# Patient Record
Sex: Male | Born: 1965 | Race: White | Hispanic: No | Marital: Married | Smoking: Never smoker
Health system: Southern US, Community
[De-identification: ages and names within clinical notes are randomized; demographics above are authoritative.]

## PROBLEM LIST (undated history)

## (undated) DIAGNOSIS — I10 Essential (primary) hypertension: Secondary | ICD-10-CM

## (undated) HISTORY — DX: Essential (primary) hypertension: I10

---

## 2000-02-05 ENCOUNTER — Other Ambulatory Visit: Admission: RE | Admit: 2000-02-05 | Discharge: 2000-02-05 | Payer: Self-pay | Admitting: Family Medicine

## 2004-09-23 ENCOUNTER — Ambulatory Visit (HOSPITAL_COMMUNITY): Admission: RE | Admit: 2004-09-23 | Discharge: 2004-09-23 | Payer: Self-pay | Admitting: Family Medicine

## 2007-10-18 ENCOUNTER — Encounter: Payer: Self-pay | Admitting: Pulmonary Disease

## 2007-10-25 ENCOUNTER — Encounter: Payer: Self-pay | Admitting: Pulmonary Disease

## 2007-11-04 ENCOUNTER — Encounter: Payer: Self-pay | Admitting: Pulmonary Disease

## 2007-11-25 ENCOUNTER — Encounter: Payer: Self-pay | Admitting: Pulmonary Disease

## 2007-12-01 ENCOUNTER — Encounter: Payer: Self-pay | Admitting: Pulmonary Disease

## 2008-01-09 ENCOUNTER — Encounter: Payer: Self-pay | Admitting: Pulmonary Disease

## 2008-03-02 ENCOUNTER — Ambulatory Visit: Payer: Self-pay | Admitting: Pulmonary Disease

## 2008-03-02 DIAGNOSIS — R05 Cough: Secondary | ICD-10-CM

## 2008-03-02 DIAGNOSIS — J309 Allergic rhinitis, unspecified: Secondary | ICD-10-CM | POA: Insufficient documentation

## 2008-03-02 DIAGNOSIS — J45909 Unspecified asthma, uncomplicated: Secondary | ICD-10-CM | POA: Insufficient documentation

## 2008-03-21 ENCOUNTER — Telehealth: Payer: Self-pay | Admitting: Pulmonary Disease

## 2008-03-27 ENCOUNTER — Telehealth: Payer: Self-pay | Admitting: Pulmonary Disease

## 2008-03-29 ENCOUNTER — Encounter: Payer: Self-pay | Admitting: Pulmonary Disease

## 2008-04-06 ENCOUNTER — Telehealth: Payer: Self-pay | Admitting: Pulmonary Disease

## 2010-03-17 ENCOUNTER — Ambulatory Visit: Payer: Self-pay | Admitting: Vascular Surgery

## 2010-06-17 ENCOUNTER — Ambulatory Visit: Payer: Self-pay | Admitting: Vascular Surgery

## 2010-07-14 ENCOUNTER — Ambulatory Visit: Payer: Self-pay | Admitting: Vascular Surgery

## 2010-07-22 ENCOUNTER — Ambulatory Visit: Payer: Self-pay | Admitting: Vascular Surgery

## 2011-03-17 NOTE — Procedures (Signed)
LOWER EXTREMITY VENOUS REFLUX EXAM   INDICATION:  Right lower extremity varicose veins with pain and  swelling.   EXAM:  Using color-flow imaging and pulse Doppler spectral analysis, the  right common femoral, superficial femoral, popliteal, posterior tibial,  greater and lesser saphenous veins are evaluated.  There is evidence  suggesting deep venous insufficiency in the right lower extremity.   The right saphenofemoral junction is not competent with Reflux of  >582milliseconds. The right GSV is not competent with Reflux of  >539milliseconds with the caliber as described below.   The right proximal short saphenous vein demonstrates competency.   GSV Diameter (used if found to be incompetent only)                                            Right    Left  Proximal Greater Saphenous Vein           0.8 cm   cm  Proximal-to-mid-thigh                     cm       cm  Mid thigh                                 1.77 cm  cm  Mid-distal thigh                          cm       cm  Distal thigh                              0.79 cm  cm  Knee                                      0.87 cm  cm   IMPRESSION:  1. Right greater saphenous vein Reflux with >572milliseconds is      identified with the caliber ranging from 0.79 cm to 1.77 cm knee to      groin.  2. The right greater saphenous vein is not aneurysmal.  3. The right greater saphenous vein is not tortuous.  4. The deep venous system is not competent with Reflux of      >554milliseconds.  5. The right lesser saphenous vein is competent.   ___________________________________________  Douglas Neal. Hart Rochester, M.D.   AS/MEDQ  D:  03/17/2010  T:  03/17/2010  Job:  [id number]

## 2011-03-17 NOTE — Assessment & Plan Note (Signed)
OFFICE VISIT   Douglas Neal, Douglas Neal  DOB:  Nov 27, 1965                                       07/14/2010  CHART#:[id number]   This patient had laser ablation of his right great saphenous vein with  greater than 20 stab phlebectomies for painful varicosities secondary to  gross reflux in the right great saphenous vein.  He tolerated the  procedure well.  Will return in 1 week for venous duplex exam to confirm  closure.     Quita Skye Hart Rochester, M.D.  Electronically Signed   JDL/MEDQ  D:  07/14/2010  T:  07/15/2010  Job:  [id number]

## 2011-03-17 NOTE — Assessment & Plan Note (Signed)
OFFICE VISIT   CASIMIR, BARCELLOS  DOB:  Nov 12, 1965                                       07/22/2010  CHART#:[id number]   Mr. Rance returns 1 week post laser ablation of his right great  saphenous vein with multiple stab phlebectomies for painful  varicosities.  He has had some mild to moderate discomfort in the thigh  and femoral area where the ablation was performed.  He has had no pain  in the stab phlebectomy sites in the calf and has had no edema.  He has  been wearing his stocking as instructed an took the ibuprofen.   On physical exam today, his blood pressure is 139/76, heart rate 66,  respirations 14.  Right lower extremity exam reveals 3+ femoral,  popliteal and posterior tibial pulses palpable.  He has moderate  ecchymosis in the mid to distal thigh and some tenderness along the  palpable great saphenous vein.   Venous duplex exam reveals no evidence of DVT with total occlusion of  the right great saphenous vein.   He was reassured regarding these findings and will return to see [place] on a  p.r.n. basis.  He will wear the stocking for 1 more week.     Quita Skye Hart Rochester, M.D.  Electronically Signed   JDL/MEDQ  D:  07/22/2010  T:  07/23/2010  Job:  [id number]

## 2011-03-17 NOTE — Assessment & Plan Note (Signed)
OFFICE VISIT   Douglas Neal, Douglas Neal  DOB:  01/27/1966                                       06/17/2010  CHART#:[id number]   The patient returns today for followup regarding his severe venous  insufficiency of the right leg with painful varicosities and swelling in  the lower third.  He has been wearing long-leg elastic compression  stockings (20 mm-30-mm gradient) as well as elevating the leg and taking  ibuprofen on a daily basis and has had no improvement of his symptoms.  He describes an aching, throbbing, stinging, itching and burning  discomfort in the thigh and calf which worsens as the day progresses.  He has no history of DVT or thrombophlebitis.  His venous duplex  performed last visit documents severe reflux throughout the right great  saphenous vein from the knee to the saphenofemoral junction with  multiple painful varicosities in the calf secondary to this.  He has no  DVT.   PHYSICAL EXAMINATION:  Today, his blood pressure is 158/86, heart rate  56, respirations 20.  Lower extremity exam reveals 3+ femoral, popliteal  and dorsalis pedis pulses in the right leg with bulging varicosities in  the medial calf and 1 large bulge in the mid thigh area over the great  saphenous vein.   I believe he should be treated with laser ablation of his right great  saphenous vein with multiple stab phlebectomies for the painful  varicosities in the calf to be performed as the same procedure.  We will  proceed with precertification to do that in the near future.     Quita Skye Hart Rochester, M.D.  Electronically Signed   JDL/MEDQ  D:  06/17/2010  T:  06/17/2010  Job:  [id number]

## 2011-03-17 NOTE — Procedures (Signed)
DUPLEX DEEP VENOUS EXAM - LOWER EXTREMITY   INDICATION:  Right greater saphenous vein ablation followup.   HISTORY:  Edema:  No.  Trauma/Surgery:  Yes.  Pain:  Yes.  PE:  No.  Previous DVT:  No.  Anticoagulants:  No.  Other:   DUPLEX EXAM:                CFV   SFV   PopV  PTV    GSV                R  L  R  L  R  L  R   L  R  L  Thrombosis    o  o  o     o     o      +  Spontaneous   +  +  +     +     +      o  Phasic        +  +  +     +     +      o  Augmentation  +  +  +     +     +      o  Compressible  +  +  +     +     +      o  Competent     +  o  +     +     +   Legend:  + - yes  o - no  p - partial  D - decreased   IMPRESSION:  Right lower extremity deep veins show no evidence of deep  venous thrombosis.  The right greater saphenous vein is thrombosed  without evidence of reflux.    _____________________________  Quita Skye. Hart Rochester, M.D.   EM/MEDQ  D:  07/22/2010  T:  07/22/2010  Job:  [id number]

## 2011-03-17 NOTE — Consult Note (Signed)
NEW PATIENT CONSULTATION   RICKARD, KENNERLY J  DOB:  08-11-1966                                       03/17/2010  CHART#:[id number]   The patient is a healthy 45 year old gentleman with painful varicosities  in the right leg which have been enlarging over the past 10-15 years.  He has no history of deep venous thrombosis or thrombophlebitis but has  bulging varicosities in the right thigh and calf area which have become  increasingly uncomfortable as the day progresses.  He develops aching,  throbbing, stinging, with itching and burning discomfort.  He has no  symptoms in the left leg.  He does have some mild swelling as the day  progresses.  He has not worn elastic compression stockings recently but  did wear some below-knee stockings in the past with no improvement.  He  does not take pain medication or elevate the legs on a regular basis.  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   CHRONIC MEDICAL PROBLEMS:  1. Asthma, currently not on medications.  2. Negative for coronary artery disease, diabetes, hypertension,      hyperlipidemia or stroke.   FAMILY HISTORY:  Negative for coronary disease, diabetes and stroke.   SOCIAL HISTORY:  He is married, has 2 children.  He is a Surveyor, mining at [employer].  He does not use tobacco, drinks  occasional alcohol.   REVIEW OF SYSTEMS:  Negative for anorexia, weight loss, chest pain,  dyspnea on exertion, palpitations, productive cough, bronchitis.  Does  rarely have wheezing associated with his asthma.  No GI or GU symptoms.  Denies claudication.  Other systems in review of systems are negative.   PHYSICAL EXAMINATION:  Blood pressure 152/99, heart rate 66,  respirations 24.  Generally, he is a healthy, middle-aged male who is in  no apparent distress.  He is alert and oriented x3.  HEENT exam is  normal.  EOMs intact.  Chest  clear to auscultation.  No rhonchi or  wheezing.  Cardiovascular exam is a regular rhythm.  No murmurs.  Carotid pulses are 3+, no bruits.  Abdomen is soft, nontender, with no  masses.  Musculoskeletal exam is free of major deformities.  Neurologic  exam is normal.  Skin is free of rashes.  Lower extremity exam reveals  3+ femoral popliteal, dorsalis pedis and posterior tibial pulses  palpable bilaterally.  Left leg is free of varicosities.  Right leg has  large, bulging varicosities along the course of the great saphenous vein  beginning in the mid thigh, extending down to the knee and also  extensively in the calf, both anterior and posteriorly.  He has no  hyperpigmentation or ulceration but does have mild edema in the right  ankle.  No spider veins are noted.   Today I ordered a venous duplex exam, which I reviewed and interpreted.  The right leg has some mild deep venous reflux but no obstruction.  He  has a large great saphenous vein on the right which has reflux  throughout, up to and including the saphenofemoral junction, which  supplies these bulging varicosities.   The patient is having significant symptoms from great saphenous reflux  with bulging varicosities.  We will treat him with long-leg  elastic  compression stockings (20 mm-30-mm gradient) as well as elevation as  much as his job will allow and ibuprofen to relieve his symptoms.  He  will return in 3 months and if he has had no improvement, I think he  should have laser ablation of the right great saphenous vein with  multiple stab phlebectomies to relieve his symptoms.     Quita Skye Hart Rochester, M.D.  Electronically Signed   JDL/MEDQ  D:  03/17/2010  T:  03/18/2010  Job:  [id number]

## 2020-05-09 ENCOUNTER — Emergency Department (HOSPITAL_COMMUNITY): Payer: BC Managed Care – PPO

## 2020-05-09 ENCOUNTER — Other Ambulatory Visit: Payer: Self-pay

## 2020-05-09 ENCOUNTER — Emergency Department (HOSPITAL_COMMUNITY)
Admission: EM | Admit: 2020-05-09 | Discharge: 2020-05-09 | Disposition: A | Discharge status: Home / Self Care | Payer: BC Managed Care – PPO | Attending: Emergency Medicine | Admitting: Emergency Medicine

## 2020-05-09 ENCOUNTER — Encounter (HOSPITAL_COMMUNITY): Payer: Self-pay | Admitting: Emergency Medicine

## 2020-05-09 DIAGNOSIS — R4789 Other speech disturbances: Secondary | ICD-10-CM | POA: Insufficient documentation

## 2020-05-09 DIAGNOSIS — R030 Elevated blood-pressure reading, without diagnosis of hypertension: Secondary | ICD-10-CM | POA: Diagnosis not present

## 2020-05-09 DIAGNOSIS — J45909 Unspecified asthma, uncomplicated: Secondary | ICD-10-CM | POA: Insufficient documentation

## 2020-05-09 LAB — COMPREHENSIVE METABOLIC PANEL
ALT: 33 U/L (ref 0–44)
AST: 18 U/L (ref 15–41)
Albumin: 4.4 g/dL (ref 3.5–5.0)
Alkaline Phosphatase: 76 U/L (ref 38–126)
Anion gap: 9 (ref 5–15)
BUN: 20 mg/dL (ref 6–20)
CO2: 23 mmol/L (ref 22–32)
Calcium: 9 mg/dL (ref 8.9–10.3)
Chloride: 107 mmol/L (ref 98–111)
Creatinine, Ser: 0.93 mg/dL (ref 0.61–1.24)
GFR calc Af Amer: >60 mL/min (ref >60)
GFR calc non Af Amer: >60 mL/min (ref >60)
Glucose, Bld: 105 mg/dL — ABNORMAL HIGH (ref 70–99)
Potassium: 4.1 mmol/L (ref 3.5–5.1)
Sodium: 139 mmol/L (ref 135–145)
Total Bilirubin: 1.1 mg/dL (ref 0.3–1.2)
Total Protein: 7.1 g/dL (ref 6.5–8.1)

## 2020-05-09 LAB — CBC WITH DIFFERENTIAL/PLATELET
Abs Immature Granulocytes: 0.03 10*3/uL (ref 0.00–0.07)
Basophils Absolute: 0 10*3/uL (ref 0.0–0.1)
Basophils Relative: 1 %
Eosinophils Absolute: 0.2 10*3/uL (ref 0.0–0.5)
Eosinophils Relative: 4 %
HCT: 45.5 % (ref 39.0–52.0)
Hemoglobin: 16.1 g/dL (ref 13.0–17.0)
Immature Granulocytes: 1 %
Lymphocytes Relative: 39 %
Lymphs Abs: 1.6 10*3/uL (ref 0.7–4.0)
MCH: 31.7 pg (ref 26.0–34.0)
MCHC: 35.4 g/dL (ref 30.0–36.0)
MCV: 89.6 fL (ref 80.0–100.0)
Monocytes Absolute: 0.3 10*3/uL (ref 0.1–1.0)
Monocytes Relative: 7 %
Neutro Abs: 2 10*3/uL (ref 1.7–7.7)
Neutrophils Relative %: 48 %
Platelets: 168 10*3/uL (ref 150–400)
RBC: 5.08 MIL/uL (ref 4.22–5.81)
RDW: 12.1 % (ref 11.5–15.5)
WBC: 4.2 10*3/uL (ref 4.0–10.5)
nRBC: 0 % (ref 0.0–0.2)

## 2020-05-09 MED ORDER — LORAZEPAM 2 MG/ML IJ SOLN
1.0000 mg | Freq: Once | INTRAMUSCULAR | Status: AC
  Administered 2020-05-09: 1 mg via INTRAVENOUS
  Filled 2020-05-09: qty 1

## 2020-05-09 MED ORDER — HYDROCHLOROTHIAZIDE 12.5 MG PO TABS: 12.5000 mg | ORAL_TABLET | Freq: Every day | ORAL | 1 refills | Status: DC

## 2020-05-09 MED ORDER — ASPIRIN 81 MG PO CHEW: 81.0000 mg | CHEWABLE_TABLET | Freq: Every day | ORAL | 1 refills | Status: DC

## 2020-05-09 MED ORDER — ASPIRIN 81 MG PO CHEW: 81.0000 mg | CHEWABLE_TABLET | Freq: Every day | ORAL | 1 refills | Status: AC

## 2020-05-09 MED ORDER — IOHEXOL 350 MG/ML SOLN
80.0000 mL | Freq: Once | INTRAVENOUS | Status: AC | PRN
  Administered 2020-05-09: 80 mL via INTRAVENOUS

## 2020-05-09 NOTE — ED Notes (Signed)
Patient transported to CT 

## 2020-05-09 NOTE — ED Triage Notes (Signed)
Pt states he is been having some memory problems since Saturday, when to [facility] and was sent here for further evaluation. Pt is AO x 4 at this time, no neuro deficit noticed on triage.

## 2020-05-09 NOTE — ED Notes (Signed)
Patient transported to MRI 

## 2020-05-09 NOTE — ED Notes (Signed)
Patient just notified this RN that he has developed left arm numbness, no other neuro deficit. Grips equal, no drift. Due to the memory issues last week the stroke panel ordered

## 2020-05-09 NOTE — ED Provider Notes (Signed)
[facility] Provider Note   CSN: [id number] Arrival date & time: 05/09/20  1103   History Chief Complaint  Patient presents with  . memory problems   Douglas Neal is a 54 y.o. male with no significant past medical history who presents for evaluation of memory issues.  Patient states over the last 4 days he has had intermittent memory problems.  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  He had no facial droop or slurred speech however states he had difficulty talking.  This lasted a few minutes and self resolved.  Patient states he has had a few more episodes over the last few days as recently as today.  Patient states earlier today he also had some tingling to his digits to his left upper extremity.  He denies any difficulty with word finding, facial droop or weakness at that time.  He has not seen a PCP in the last year and a half.  Denies headache, lightheadedness, dizziness, neck pain, neck stiffness, facial droop, weakness, chest pain, shortness of breath abdominal pain, diarrhea, dysuria.  Denies additional aggravating or relieving factors.  History obtained from patient and past medical records.  No interpreter used.  HPI     History reviewed. No pertinent past medical history.  Patient Active Problem List   Diagnosis Date Noted  . ALLERGIC RHINITIS 03/02/2008  . ASTHMA 03/02/2008  . COUGH 03/02/2008    History reviewed. No pertinent surgical history.     No family history on file.  Social History   Tobacco Use  . Smoking status: Never Smoker  . Smokeless tobacco: Never Used  Substance Use Topics  . Alcohol use: Yes  . Drug use: Never    Home Medications Prior to Admission medications   Medication Sig Start Date End Date Taking? Authorizing Provider  sildenafil (REVATIO) 20 MG tablet Take 20 mg by mouth daily as needed (erectile dysfunction).   Yes  [provider]  aspirin 81 MG chewable tablet Chew 1 tablet (81 mg total) by mouth daily. 05/09/20   Analyn Matusek A, PA-C  hydrochlorothiazide (HYDRODIURIL) 12.5 MG tablet Take 1 tablet (12.5 mg total) by mouth daily. 05/09/20   Geraldine Tesar A, PA-C    Allergies    Penicillins  Review of Systems   Review of Systems  Constitutional: Negative.   HENT: Negative.   Respiratory: Negative.   Cardiovascular: Negative.   Gastrointestinal: Negative.   Genitourinary: Negative.   Musculoskeletal: Negative.   Skin: Negative.   Neurological: Positive for numbness (Left 3 digit tinging). Negative for dizziness, tremors, seizures, syncope, facial asymmetry, speech difficulty, weakness, light-headedness and headaches.       Difficulty with word finding  All other systems reviewed and are negative.   Physical Exam Updated Vital Signs BP (!) 150/89 (BP Location: Right Arm)   Pulse 87   Temp 98.3 F (36.8 C) (Oral)   Resp 15   Ht 5\' 11"  (1.803 m)   Wt 88.5 kg   SpO2 97%   BMI 27.21 kg/m   Physical Exam Physical Exam  Constitutional: Pt is oriented to person, place, and time. Pt appears well-developed and well-nourished. No distress.  HENT:  Head: Normocephalic and atraumatic.  Mouth/Throat: Oropharynx is clear and moist.  Eyes: Conjunctivae and EOM are normal. Pupils are equal, round, and reactive to light. No scleral icterus.  No horizontal, vertical  or rotational nystagmus  Neck: Normal range of motion. Neck supple.  Full active and passive ROM without pain No midline or paraspinal tenderness No nuchal rigidity or meningeal signs  Cardiovascular: Normal rate, regular rhythm and intact distal pulses.   Pulmonary/Chest: Effort normal and breath sounds normal. No respiratory distress. Pt has no wheezes. No rales.  Abdominal: Soft. Bowel sounds are normal. There is no tenderness. There is no rebound and no guarding.  Musculoskeletal: Normal range of motion.    Lymphadenopathy:    No cervical adenopathy.  Neurological: Pt. is alert and oriented to person, place, and time. He has normal reflexes. No cranial nerve deficit.  Exhibits normal muscle tone. Coordination normal.  Mental Status:  Alert, oriented, thought content appropriate. Speech fluent without evidence of aphasia. Able to follow 2 step commands without difficulty.  Cranial Nerves:  II:  Peripheral visual fields grossly normal, pupils equal, round, reactive to light III,IV, VI: ptosis not present, extra-ocular motions intact bilaterally  V,VII: smile symmetric, facial light touch sensation equal VIII: hearing grossly normal bilaterally  IX,X: midline uvula rise  XI: bilateral shoulder shrug equal and strong XII: midline tongue extension  Motor:  5/5 in upper and lower extremities bilaterally including strong and equal grip strength and dorsiflexion/plantar flexion Sensory: Pinprick and light touch normal in all extremities.  Deep Tendon Reflexes: 2+ and symmetric  Cerebellar: normal finger-to-nose with bilateral upper extremities Gait: normal gait and balance CV: distal pulses palpable throughout   Skin: Skin is warm and dry. No rash noted. Pt is not diaphoretic.  Psychiatric: Pt has a normal mood and affect. Behavior is normal. Judgment and thought content normal.  Nursing note and vitals reviewed. ED Results / Procedures / Treatments   Labs (all labs ordered are listed, but only abnormal results are displayed) Labs Reviewed  COMPREHENSIVE METABOLIC PANEL - Abnormal; Notable for the following components:      Result Value   Glucose, Bld 105 (*)    All other components within normal limits  CBC WITH DIFFERENTIAL/PLATELET    EKG None  Radiology CT Angio Head W or Wo Contrast  Result Date: 05/09/2020 CLINICAL DATA:  Left arm numbness, memory problems EXAM: CT ANGIOGRAPHY HEAD AND NECK TECHNIQUE: Multidetector CT imaging of the head and neck was performed using the standard  protocol during bolus administration of intravenous contrast. Multiplanar CT image reconstructions and MIPs were obtained to evaluate the vascular anatomy. Carotid stenosis measurements (when applicable) are obtained utilizing NASCET criteria, using the distal internal carotid diameter as the denominator. CONTRAST:  80mL OMNIPAQUE IOHEXOL 350 MG/ML SOLN COMPARISON:  None. FINDINGS: CTA NECK Aortic arch: Great vessel origins are patent. Right carotid system: Patent. No measurable stenosis or evidence of dissection Left carotid system: Patent. No measurable stenosis or evidence of dissection. Vertebral arteries: Patent and codominant.  No measurable stenosis. Skeleton: Mild cervical spine degenerative changes. Other neck: No mass or adenopathy. Upper chest: No apical lung mass. Review of the MIP images confirms the above findings CTA HEAD Anterior circulation: Intracranial internal carotid arteries are patent. Anterior and middle cerebral arteries are patent. Posterior circulation: Intracranial vertebral arteries, basilar artery, and posterior cerebral arteries are patent. There is a large right posterior communicating artery with diminutive right P1 PCA. Venous sinuses: Patent as allowed by contrast bolus timing. Review of the MIP images confirms the above findings IMPRESSION: No large vessel occlusion, hemodynamically significant stenosis, or evidence of dissection. Electronically Signed   By: Guadlupe Spanish M.D.   On: 05/09/2020  18:46   CT HEAD WO CONTRAST  Result Date: 05/09/2020 CLINICAL DATA:  Alteration in memory EXAM: CT HEAD WITHOUT CONTRAST TECHNIQUE: Contiguous axial images were obtained from the base of the skull through the vertex without intravenous contrast. COMPARISON:  None. FINDINGS: Brain: Ventricles and sulci are normal in size and configuration. There is no intracranial mass, hemorrhage, extra-axial fluid collection, or midline shift. The brain parenchyma appears unremarkable. No evident acute  infarct. Vascular: No hyperdense vessel.  No evident vascular calcification. Skull: Bony calvarium appears intact. Sinuses/Orbits: There is opacification throughout much of the right maxillary antrum. There is mucosal thickening in several ethmoid air cells. There is a concha bullosa on the left, an anatomic variant. There is rightward deviation of the nasal septum. Orbits appear symmetric bilaterally. Other: Mastoid air cells are clear. IMPRESSION: Normal appearing brain parenchyma. No acute infarct. No mass or hemorrhage. There are foci paranasal sinus disease at several sites. Deviated nasal septum. Electronically Signed   By: Bretta Bang III M.D.   On: 05/09/2020 15:08   CT Angio Neck W and/or Wo Contrast  Result Date: 05/09/2020 CLINICAL DATA:  Left arm numbness, memory problems EXAM: CT ANGIOGRAPHY HEAD AND NECK TECHNIQUE: Multidetector CT imaging of the head and neck was performed using the standard protocol during bolus administration of intravenous contrast. Multiplanar CT image reconstructions and MIPs were obtained to evaluate the vascular anatomy. Carotid stenosis measurements (when applicable) are obtained utilizing NASCET criteria, using the distal internal carotid diameter as the denominator. CONTRAST:  30mL OMNIPAQUE IOHEXOL 350 MG/ML SOLN COMPARISON:  None. FINDINGS: CTA NECK Aortic arch: Great vessel origins are patent. Right carotid system: Patent. No measurable stenosis or evidence of dissection Left carotid system: Patent. No measurable stenosis or evidence of dissection. Vertebral arteries: Patent and codominant.  No measurable stenosis. Skeleton: Mild cervical spine degenerative changes. Other neck: No mass or adenopathy. Upper chest: No apical lung mass. Review of the MIP images confirms the above findings CTA HEAD Anterior circulation: Intracranial internal carotid arteries are patent. Anterior and middle cerebral arteries are patent. Posterior circulation: Intracranial vertebral  arteries, basilar artery, and posterior cerebral arteries are patent. There is a large right posterior communicating artery with diminutive right P1 PCA. Venous sinuses: Patent as allowed by contrast bolus timing. Review of the MIP images confirms the above findings IMPRESSION: No large vessel occlusion, hemodynamically significant stenosis, or evidence of dissection. Electronically Signed   By: Guadlupe Spanish M.D.   On: 05/09/2020 18:46   MR BRAIN WO CONTRAST  Result Date: 05/09/2020 CLINICAL DATA:  Left arm numbness, memory problems EXAM: MRI HEAD WITHOUT CONTRAST TECHNIQUE: Multiplanar, multiecho pulse sequences of the brain and surrounding structures were obtained without intravenous contrast. COMPARISON:  None. FINDINGS: Brain: There is no acute infarction or intracranial hemorrhage. There is no intracranial mass, mass effect, or edema. There is no hydrocephalus or extra-axial fluid collection. Ventricles and sulci are normal in size and configuration. Vascular: Major vessel flow voids at the skull base are preserved. Skull and upper cervical spine: Normal marrow signal is preserved. Sinuses/Orbits: Right maxillary sinus retention cyst. Orbits are unremarkable. Other: Sella is unremarkable.  Mastoid air cells are clear. IMPRESSION: No evidence of recent infarction, hemorrhage, or mass. Electronically Signed   By: Guadlupe Spanish M.D.   On: 05/09/2020 17:41    Procedures Procedures (including critical care time)  Medications Ordered in ED Medications  LORazepam (ATIVAN) injection 1 mg (1 mg Intravenous Given 05/09/20 1655)  iohexol (OMNIPAQUE) 350 MG/ML injection 80  mL (80 mLs Intravenous Contrast Given 05/09/20 1801)    ED Course  I have reviewed the triage vital signs and the nursing notes.  Pertinent labs & imaging results that were available during my care of the patient were reviewed by me and considered in my medical decision making (see chart for details).  54 year old male presents for  evaluation of intermittent difficulty with word finding.  He is afebrile, nonseptic, non-ill-appearing.  Symptoms have been intermittent over the last 4 days with approximately 3 episodes.  No seizure-like activity.  He denies any recent head trauma.  Did have 3 digit tingling to his left upper extremity earlier today which self resolved after approximately a few seconds.  Lungs clear.  Abdomen soft, nontender.  Patient with stroke work-up panel ordered by triage.  Labs and imaging personally reviewed and interpreted: CBC without leukocytosis Metabolic panel without electrolyte, renal abnormality CT head without acute findings  CONSULT with Dr. Amada Jupiter with neurology. Recommends CTA head and neck as well as MRI brain without contrast. States that if both tests were both negative his work-up is reassuring and he may follow-up outpatient does not need admission for TIA work-up. Dr. Amada Jupiter suggested possible symptoms related to elevated blood pressure.  CTA head negative for acute findings CTA neck negative for acute findings MR brain without acute infarct, mass, bleed  Patient reassessed. He continues to have nonfocal neuro exam. Still has elevated blood pressures here in the emergency department however have low suspicion for hypertensive urgency or emergency.  We will start him on blood pressure medication as well as aspirin.  Will refer outpatient to neurology as his imaging is reassuring.  Low suspicion for acute CVA, dissection.  The patient has been appropriately medically screened and/or stabilized in the ED. I have low suspicion for any other emergent medical condition which would require further screening, evaluation or treatment in the ED or require inpatient management.  Patient is hemodynamically stable and in no acute distress.  Patient able to ambulate in department prior to ED.  Evaluation does not show acute pathology that would require ongoing or additional emergent  interventions while in the emergency department or further inpatient treatment.  I have discussed the diagnosis with the patient and answered all questions.  Pain is been managed while in the emergency department and patient has no further complaints prior to discharge.  Patient is comfortable with plan discussed in room and is stable for discharge at this time.  I have discussed strict return precautions for returning to the emergency department.  Patient was encouraged to follow-up with PCP/specialist refer to at discharge.    MDM Rules/Calculators/A&P                          Final Clinical Impression(s) / ED Diagnoses Final diagnoses:  Word finding difficulty  Elevated blood pressure reading    Rx / DC Orders ED Discharge Orders         Ordered    Ambulatory referral to Neurology     Discontinue  Reprint    Comments: An appointment is requested in approximately: 1 week   05/09/20 1856    hydrochlorothiazide (HYDRODIURIL) 12.5 MG tablet  Daily     Discontinue  Reprint     05/09/20 1856    aspirin 81 MG chewable tablet  Daily     Discontinue  Reprint     05/09/20 1856  Jakobie Henslee A, PA-C 05/09/20 1859    Bethann Berkshire, MD 05/10/20 0800

## 2020-05-09 NOTE — Discharge Instructions (Signed)
Start taking a baby aspirin and the blood pressure medication.  I have referred you outpatient to neurology.  If they have not called you within 1 week I would suggest you call to schedule an appointment.  Return for any new or worsening symptoms.

## 2020-05-09 NOTE — ED Notes (Signed)
1mg  ativan wasted in sharps w/ .

## 2020-05-09 NOTE — ED Notes (Signed)
Pt verbalized understanding of discharge instructions. Follow up care and prescriptions reviewed, pt had no further questions. 

## 2020-05-14 ENCOUNTER — Encounter (HOSPITAL_COMMUNITY): Payer: Self-pay

## 2020-05-14 ENCOUNTER — Other Ambulatory Visit: Payer: Self-pay

## 2020-05-14 ENCOUNTER — Emergency Department (HOSPITAL_COMMUNITY)
Admission: EM | Admit: 2020-05-14 | Discharge: 2020-05-14 | Disposition: A | Discharge status: Home / Self Care | Payer: BC Managed Care – PPO | Attending: Emergency Medicine | Admitting: Emergency Medicine

## 2020-05-14 DIAGNOSIS — Z5321 Procedure and treatment not carried out due to patient leaving prior to being seen by health care provider: Secondary | ICD-10-CM | POA: Insufficient documentation

## 2020-05-14 DIAGNOSIS — R200 Anesthesia of skin: Secondary | ICD-10-CM | POA: Insufficient documentation

## 2020-05-14 LAB — COMPREHENSIVE METABOLIC PANEL
ALT: 34 U/L (ref 0–44)
AST: 19 U/L (ref 15–41)
Albumin: 4.5 g/dL (ref 3.5–5.0)
Alkaline Phosphatase: 83 U/L (ref 38–126)
Anion gap: 9 (ref 5–15)
BUN: 24 mg/dL — ABNORMAL HIGH (ref 6–20)
CO2: 27 mmol/L (ref 22–32)
Calcium: 9.4 mg/dL (ref 8.9–10.3)
Chloride: 102 mmol/L (ref 98–111)
Creatinine, Ser: 0.94 mg/dL (ref 0.61–1.24)
GFR calc Af Amer: >60 mL/min (ref >60)
GFR calc non Af Amer: >60 mL/min (ref >60)
Glucose, Bld: 109 mg/dL — ABNORMAL HIGH (ref 70–99)
Potassium: 3.4 mmol/L — ABNORMAL LOW (ref 3.5–5.1)
Sodium: 138 mmol/L (ref 135–145)
Total Bilirubin: 0.6 mg/dL (ref 0.3–1.2)
Total Protein: 7.2 g/dL (ref 6.5–8.1)

## 2020-05-14 LAB — CBC
HCT: 44.4 % (ref 39.0–52.0)
Hemoglobin: 16.2 g/dL (ref 13.0–17.0)
MCH: 32.1 pg (ref 26.0–34.0)
MCHC: 36.5 g/dL — ABNORMAL HIGH (ref 30.0–36.0)
MCV: 88.1 fL (ref 80.0–100.0)
Platelets: 206 10*3/uL (ref 150–400)
RBC: 5.04 MIL/uL (ref 4.22–5.81)
RDW: 12.1 % (ref 11.5–15.5)
WBC: 4.6 10*3/uL (ref 4.0–10.5)
nRBC: 0 % (ref 0.0–0.2)

## 2020-05-14 LAB — I-STAT CHEM 8, ED
BUN: 26 mg/dL — ABNORMAL HIGH (ref 6–20)
Calcium, Ion: 1.26 mmol/L (ref 1.15–1.40)
Chloride: 99 mmol/L (ref 98–111)
Creatinine, Ser: 0.9 mg/dL (ref 0.61–1.24)
Glucose, Bld: 106 mg/dL — ABNORMAL HIGH (ref 70–99)
HCT: 44 % (ref 39.0–52.0)
Hemoglobin: 15 g/dL (ref 13.0–17.0)
Potassium: 3.4 mmol/L — ABNORMAL LOW (ref 3.5–5.1)
Sodium: 144 mmol/L (ref 135–145)
TCO2: 23 mmol/L (ref 22–32)

## 2020-05-14 LAB — APTT: aPTT: 30 seconds (ref 24–36)

## 2020-05-14 LAB — DIFFERENTIAL
Abs Immature Granulocytes: 0.02 10*3/uL (ref 0.00–0.07)
Basophils Absolute: 0 10*3/uL (ref 0.0–0.1)
Basophils Relative: 1 %
Eosinophils Absolute: 0.2 10*3/uL (ref 0.0–0.5)
Eosinophils Relative: 4 %
Immature Granulocytes: 0 %
Lymphocytes Relative: 36 %
Lymphs Abs: 1.6 10*3/uL (ref 0.7–4.0)
Monocytes Absolute: 0.3 10*3/uL (ref 0.1–1.0)
Monocytes Relative: 6 %
Neutro Abs: 2.4 10*3/uL (ref 1.7–7.7)
Neutrophils Relative %: 53 %

## 2020-05-14 LAB — PROTIME-INR
INR: 0.9 (ref 0.8–1.2)
Prothrombin Time: 12.2 seconds (ref 11.4–15.2)

## 2020-05-14 MED ORDER — SODIUM CHLORIDE 0.9% FLUSH: 3.0000 mL | Freq: Once | INTRAVENOUS | Status: DC

## 2020-05-14 NOTE — ED Triage Notes (Signed)
Pt presents to ED from home with complaints of left arm numbness since 05/04/20 that has not improved.He reports having ~ 20 seconds today when he was trying to speak, but no words would come to him.  PT denies CP, SOB, worsening of numbness, unilateral weakness. HE states he was here 7/8 with full workup but they were unable to find anything.

## 2020-05-14 NOTE — ED Notes (Signed)
Patient stated he no longer wanted to wait and has left.

## 2020-06-04 ENCOUNTER — Other Ambulatory Visit: Payer: Self-pay

## 2020-06-04 ENCOUNTER — Encounter: Payer: Self-pay | Admitting: Diagnostic Neuroimaging

## 2020-06-04 ENCOUNTER — Ambulatory Visit (INDEPENDENT_AMBULATORY_CARE_PROVIDER_SITE_OTHER): Payer: BC Managed Care – PPO | Admitting: Diagnostic Neuroimaging

## 2020-06-04 VITALS — BP 163/84 | HR 66 | Ht 71.0 in | Wt 214.0 lb

## 2020-06-04 DIAGNOSIS — R413 Other amnesia: Secondary | ICD-10-CM

## 2020-06-04 DIAGNOSIS — G459 Transient cerebral ischemic attack, unspecified: Secondary | ICD-10-CM

## 2020-06-04 NOTE — Progress Notes (Addendum)
[facility]  PATIENT: CABOT CROMARTIE DOB: 1966-04-15  REFERRING CLINICIAN: Henderly, Britni A, PA-C HISTORY FROM: patient  REASON FOR VISIT: new consult    HISTORICAL  CHIEF COMPLAINT:  Chief Complaint  Patient presents with  . Memory Loss    rm 6 New Pt,ED FU  "episodes of word finding dificulty x 1 month"  MMSE 29     HISTORY OF PRESENT ILLNESS:   54 year old male here for evaluation of transient word finding difficulties and transient left-sided numbness.  Patient has had some several brief episodes of this over the past few weeks.  Episodes typically last just a few seconds at a time.  Patient went to the hospital for evaluation on 05/14/2020.  MRI and CTA of the head neck were unremarkable.  Patient has been having increased blood pressure lately.  He has followed up with cardiology and is on hydrochlorothiazide.  He has echocardiogram and stress test scheduled for tomorrow.  Patient has been under slightly more stress lately.  He averages 6 hours of sleep per night.    REVIEW OF SYSTEMS: Full 14 system review of systems performed and negative with exception of: As per HPI.  ALLERGIES: Allergies  Allergen Reactions  . Penicillins Other (See Comments)    Childhood  rash    HOME MEDICATIONS: Outpatient Medications Prior to Visit  Medication Sig Dispense Refill  . aspirin 81 MG chewable tablet Chew 1 tablet (81 mg total) by mouth daily. 30 tablet 1  . hydrochlorothiazide (HYDRODIURIL) 25 MG tablet Take 25 mg by mouth daily.    . sildenafil (REVATIO) 20 MG tablet Take 20 mg by mouth daily as needed (erectile dysfunction).    . hydrochlorothiazide (HYDRODIURIL) 12.5 MG tablet Take 1 tablet (12.5 mg total) by mouth daily. 30 tablet 1   No facility-administered medications prior to visit.    PAST MEDICAL HISTORY: Past Medical History:  Diagnosis Date  . Hypertension     PAST SURGICAL HISTORY: History reviewed. No pertinent surgical  history.  FAMILY HISTORY: Family History  Problem Relation Age of Onset  . Lung cancer Mother   . Hypertension Mother   . Hypertension Father     SOCIAL HISTORY: Social History   Socioeconomic History  . Marital status: Married    Spouse name: Not on file  . Number of children: Not on file  . Years of education: Not on file  . Highest education level: Bachelor's degree (e.g., BA, AB, BS)  Occupational History  . Not on file  Tobacco Use  . Smoking status: Never Smoker  . Smokeless tobacco: Never Used  Substance and Sexual Activity  . Alcohol use: Yes    Comment: socially  . Drug use: Never  . Sexual activity: Not on file  Other Topics Concern  . Not on file  Social History Narrative  . Not on file   Social Determinants of Health   Financial Resource Strain:   . Difficulty of Paying Living Expenses:   Food Insecurity:   . Worried About Programme researcher, broadcasting/film/video in the Last Year:   . Barista in the Last Year:   Transportation Needs:   . Freight forwarder (Medical):   Marland Kitchen Lack of Transportation (Non-Medical):   Physical Activity:   . Days of Exercise per Week:   . Minutes of Exercise per Session:   Stress:   . Feeling of Stress :   Social Connections:   . Frequency of Communication with Friends and  Family:   . Frequency of Social Gatherings with Friends and Family:   . Attends Religious Services:   . Active Member of Clubs or Organizations:   . Attends Banker Meetings:   Marland Kitchen Marital Status:   Intimate Partner Violence:   . Fear of Current or Ex-Partner:   . Emotionally Abused:   Marland Kitchen Physically Abused:   . Sexually Abused:      PHYSICAL EXAM  GENERAL EXAM/CONSTITUTIONAL: Vitals:  Vitals:   06/04/20 1515  BP: (!) 163/84  Pulse: 66  Weight: 214 lb (97.1 kg)  Height: 5\' 11"  (1.803 m)   Body mass index is 29.85 kg/m. Wt Readings from Last 3 Encounters:  06/04/20 214 lb (97.1 kg)  05/09/20 195 lb 1.7 oz (88.5 kg)    Patient is in  no distress; well developed, nourished and groomed; neck is supple  CARDIOVASCULAR:  Examination of carotid arteries is normal; no carotid bruits  Regular rate and rhythm, no murmurs  Examination of peripheral vascular system by observation and palpation is normal  EYES:  Ophthalmoscopic exam of optic discs and posterior segments is normal; no papilledema or hemorrhages No exam data present  MUSCULOSKELETAL:  Gait, strength, tone, movements noted in Neurologic exam below  NEUROLOGIC: MENTAL STATUS:  MMSE - Mini Mental State Exam 06/04/2020  Orientation to time 5  Orientation to Place 4  Registration 3  Attention/ Calculation 5  Recall 3  Language- name 2 objects 2  Language- repeat 1  Language- follow 3 step command 3  Language- read & follow direction 1  Write a sentence 1  Copy design 1  Total score 29    awake, alert, oriented to person, place and time  recent and remote memory intact  normal attention and concentration  language fluent, comprehension intact, naming intact  fund of knowledge appropriate  CRANIAL NERVE:   2nd - no papilledema on fundoscopic exam  2nd, 3rd, 4th, 6th - pupils equal and reactive to light, visual fields full to confrontation, extraocular muscles intact, no nystagmus  5th - facial sensation symmetric  7th - facial strength symmetric  8th - hearing intact  9th - palate elevates symmetrically, uvula midline  11th - shoulder shrug symmetric  12th - tongue protrusion midline  MOTOR:   normal bulk and tone, full strength in the BUE, BLE  SENSORY:   normal and symmetric to light touch, temperature, vibration  COORDINATION:   finger-nose-finger, fine finger movements normal  REFLEXES:   deep tendon reflexes present and symmetric  GAIT/STATION:   narrow based gait     DIAGNOSTIC DATA (LABS, IMAGING, TESTING) - I reviewed patient records, labs, notes, testing and imaging myself where available.  Lab Results   Component Value Date   WBC 4.6 05/14/2020   HGB 15.0 05/14/2020   HCT 44.0 05/14/2020   MCV 88.1 05/14/2020   PLT 206 05/14/2020      Component Value Date/Time   NA 144 05/14/2020 1821   K 3.4 (L) 05/14/2020 1821   CL 99 05/14/2020 1821   CO2 27 05/14/2020 1811   GLUCOSE 106 (H) 05/14/2020 1821   BUN 26 (H) 05/14/2020 1821   CREATININE 0.90 05/14/2020 1821   CALCIUM 9.4 05/14/2020 1811   PROT 7.2 05/14/2020 1811   ALBUMIN 4.5 05/14/2020 1811   AST 19 05/14/2020 1811   ALT 34 05/14/2020 1811   ALKPHOS 83 05/14/2020 1811   BILITOT 0.6 05/14/2020 1811   GFRNONAA >60 05/14/2020 1811   GFRAA >60 05/14/2020  1811   No results found for: CHOL, HDL, LDLCALC, LDLDIRECT, TRIG, CHOLHDL No results found for: LKGM0N No results found for: VITAMINB12 No results found for: TSH   05/09/20 CTA head / neck - No large vessel occlusion, hemodynamically significant stenosis, or evidence of dissection.  05/09/20 MRI brain [I reviewed images myself and agree with interpretation. -VRP]  - No evidence of recent infarction, hemorrhage, or mass.   ASSESSMENT AND PLAN  54 y.o. year old male here with:  Dx:  1. Memory loss   2. TIA (transient ischemic attack)     PLAN:  INTERMITENT LEFT ARM NUMBNESS AND WORD FINDING DIFFICULTY (? TIA, stress, pinched nerve, sleep issues) - check B12, TSH - follow up echocardiogram (per cardiology tomorrow)   Orders Placed This Encounter  Procedures  . Vitamin B12  . TSH  . ECHOCARDIOGRAM COMPLETE    Return for pending if symptoms worsen or fail to improve.    Suanne Marker, MD 06/04/2020, 4:10 PM Certified in Neurology, Neurophysiology and Neuroimaging  [facility] [address], [address] [place], [place] [address] [phone]

## 2020-06-05 ENCOUNTER — Encounter: Payer: Self-pay | Admitting: *Deleted

## 2020-06-05 LAB — VITAMIN B12: Vitamin B-12: 366 pg/mL (ref 232–1245)

## 2020-06-05 LAB — TSH: TSH: 1.9 u[IU]/mL (ref 0.450–4.500)

## 2020-10-14 IMAGING — MR MR HEAD W/O CM
12 of 13 series · 44 of 48 positions shown · non-contrast
Comparison: None.

CLINICAL DATA: Left arm numbness, memory problems

EXAM:
MRI HEAD WITHOUT CONTRAST
TECHNIQUE: Multiplanar, multiecho pulse sequences of the brain and surrounding
structures were obtained without intravenous contrast.

[Series 5: DWI · axial · 3.0mm · 0.88mm/px · z∈[-113,+38]mm · 8 of 104 slices shown (1 of 4)]
[im 1/104]
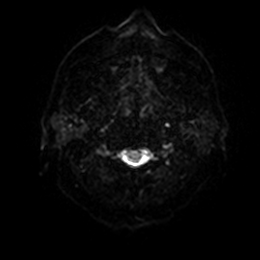
[im 15/104]
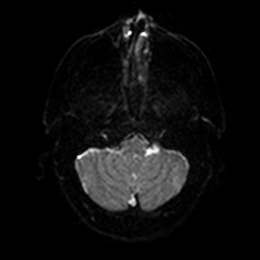
[im 30/104]
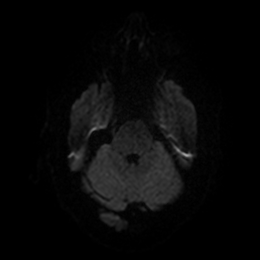
[im 45/104]
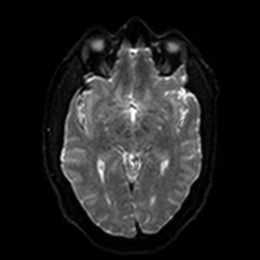
[im 59/104]
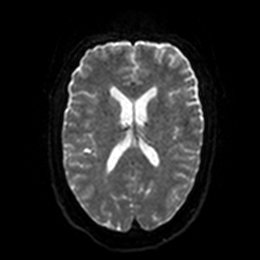
[im 74/104]
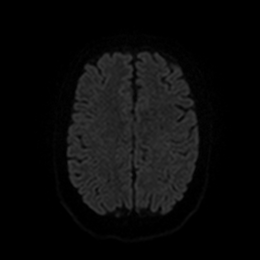
[im 89/104]
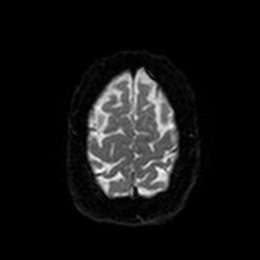
[im 104/104]
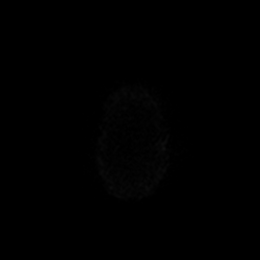

[Series 6: DWI · axial · 3.0mm · 0.88mm/px · z∈[-113,+38]mm · 4 of 52 slices shown (2 of 4)]
[im 1/52]
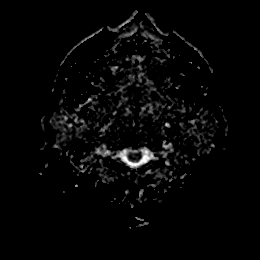
[im 18/52]
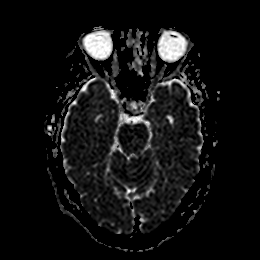
[im 35/52]
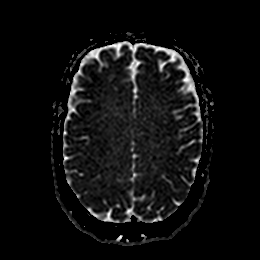
[im 52/52]
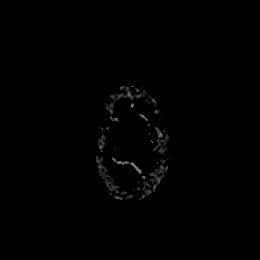

[Series 7: DWI · coronal · 4.0mm · 0.88mm/px · 6 of 76 slices shown (3 of 4)]
[im 1/76]
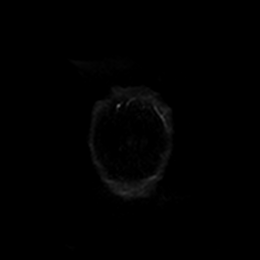
[im 16/76]
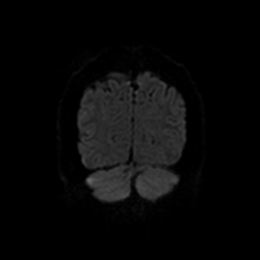
[im 31/76]
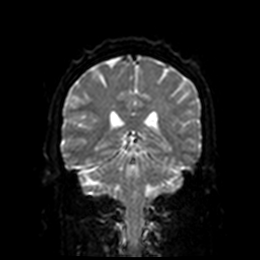
[im 46/76]
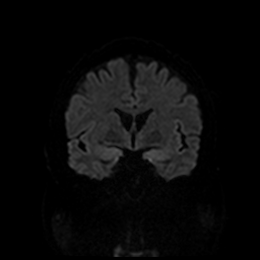
[im 61/76]
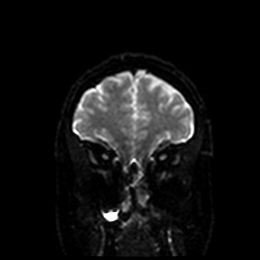
[im 76/76]
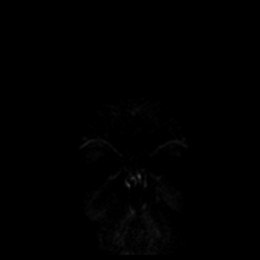

[Series 8: DWI · coronal · 4.0mm · 0.88mm/px · 3 of 38 slices shown (4 of 4)]
[im 1/38]
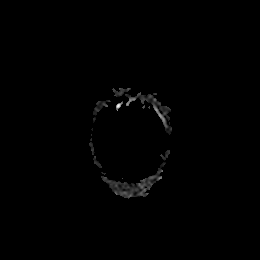
[im 19/38]
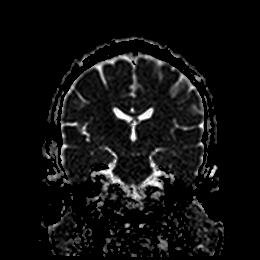
[im 38/38]
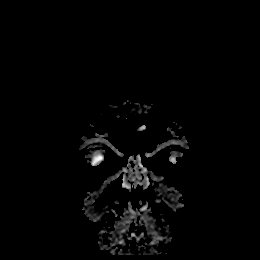

[Series 9: T1 · sagittal · 5.0mm · 0.75mm/px · 2 of 23 slices shown]
[im 1/23]
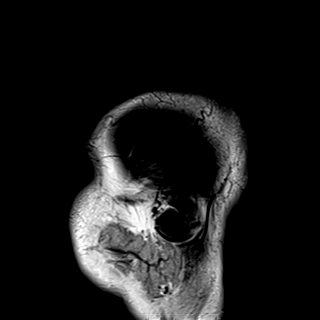
[im 23/23]
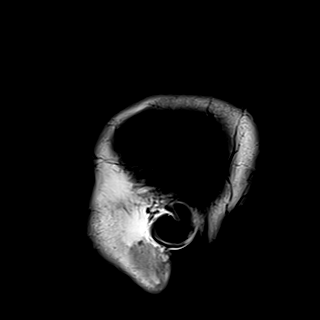

[Series 10: T2 · axial · 5.0mm · 0.90mm/px · z∈[-114,+40]mm · 2 of 27 slices shown (1 of 2)]
[im 1/27]
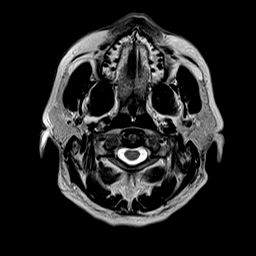
[im 27/27]
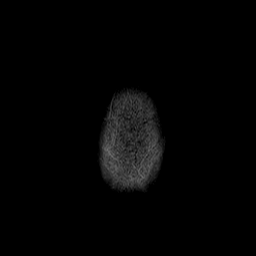

[Series 12: mag_images · axial · 3.0mm · 0.90mm/px · z∈[-113,+38]mm · 4 of 52 slices shown]
[im 1/52]
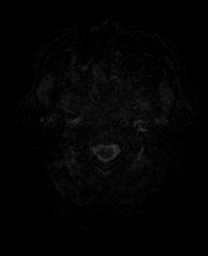
[im 18/52]
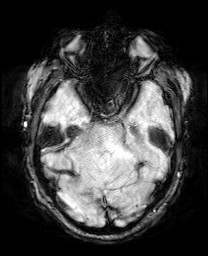
[im 35/52]
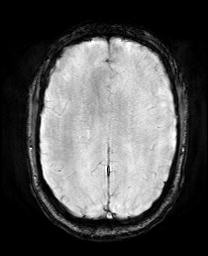
[im 52/52]
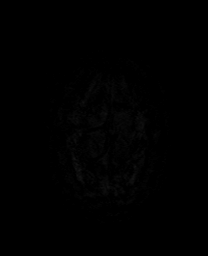

[Series 13: pha_images · axial · 3.0mm · 0.90mm/px · z∈[-113,+38]mm · 4 of 52 slices shown]
[im 1/52]
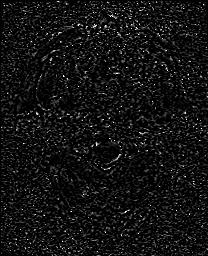
[im 18/52]
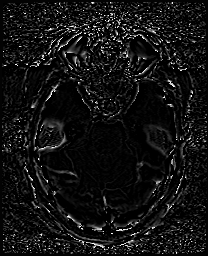
[im 35/52]
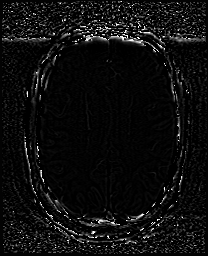
[im 52/52]
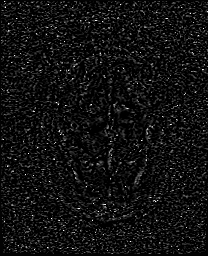

[Series 14: swi_images · axial · 3.0mm · 0.90mm/px · z∈[-113,+38]mm · 4 of 52 slices shown]
[im 1/52]
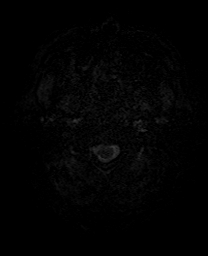
[im 18/52]
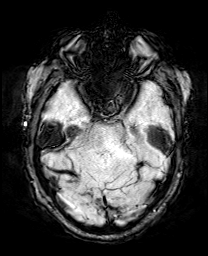
[im 35/52]
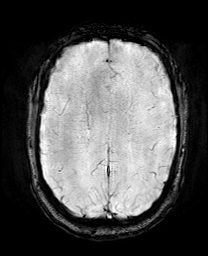
[im 52/52]
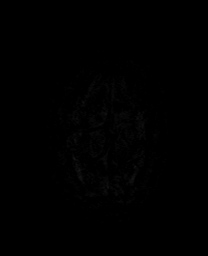

[Series 15: mip_images(sw) · axial · 24.0mm · 0.90mm/px · z∈[-103,+28]mm · 3 of 45 slices shown]
[im 1/45]
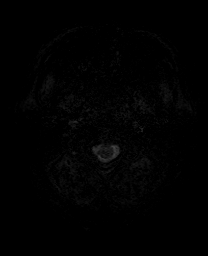
[im 23/45]
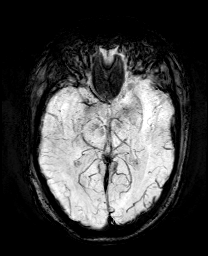
[im 45/45]
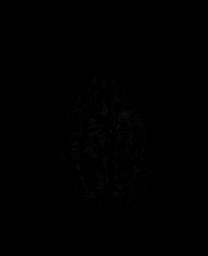

[Series 16: FLAIR · axial · 5.0mm · 0.45mm/px · z∈[-115,+39]mm · 2 of 27 slices shown]
[im 1/27]
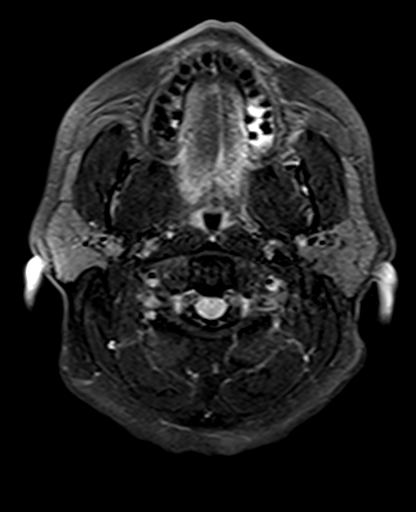
[im 27/27]
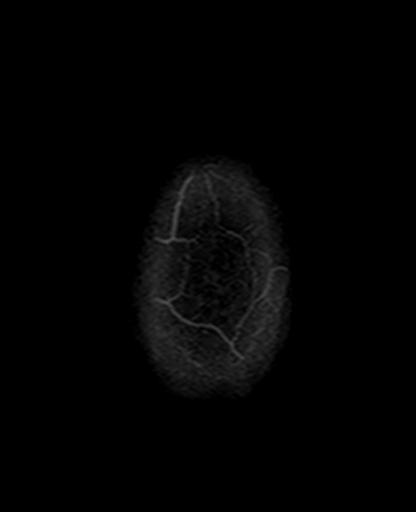

[Series 18: T2 · coronal · 5.0mm · 0.34mm/px · 2 of 31 slices shown (2 of 2)]
[im 1/31]
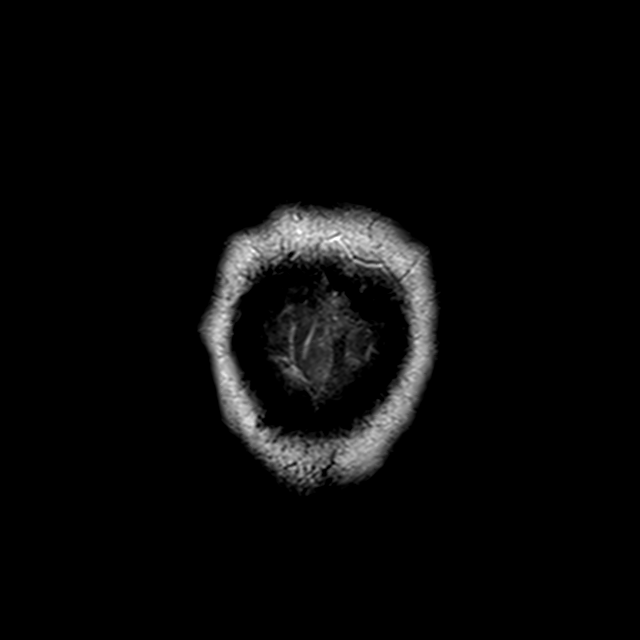
[im 31/31]
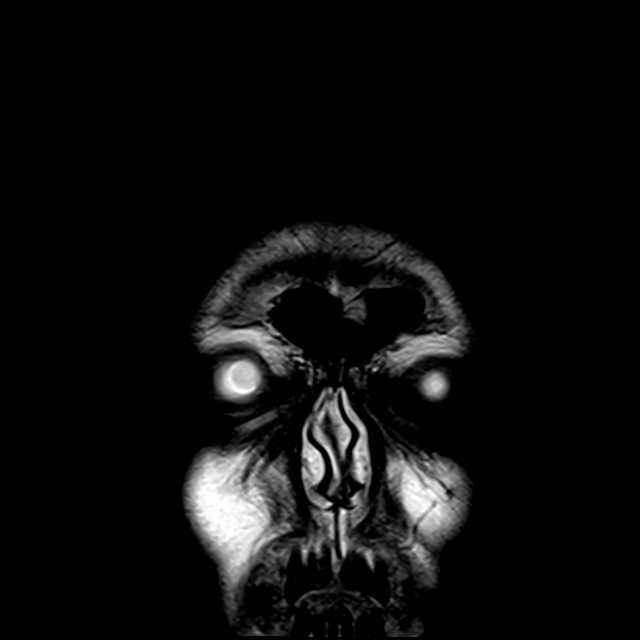

[44 of 48 positions shown; findings below may reference images not displayed]

FINDINGS: Brain: There is no acute infarction or intracranial hemorrhage.
There is no intracranial mass, mass effect, or edema. There is no
hydrocephalus or extra-axial fluid collection. Ventricles and sulci
are normal in size and configuration.

Vascular: Major vessel flow voids at the skull base are preserved.

Skull and upper cervical spine: Normal marrow signal is preserved.

Sinuses/Orbits: Right maxillary sinus retention cyst. Orbits are
unremarkable.

Other: Sella is unremarkable.  Mastoid air cells are clear.
IMPRESSION: No evidence of recent infarction, hemorrhage, or mass.

## 2020-10-14 IMAGING — CT CT HEAD W/O CM
4 series · 16 of 47 positions shown, 18 images · non-contrast
Comparison: None.

CLINICAL DATA: Alteration in memory

EXAM:
CT HEAD WITHOUT CONTRAST
TECHNIQUE: Contiguous axial images were obtained from the base of the skull
through the vertex without intravenous contrast.

[Series 3: head without · axial · non-contrast · 0.48mm/px · z∈[-70,+55]mm · 7 of 35 slices shown, 9 images]
[im 5/35  brain]
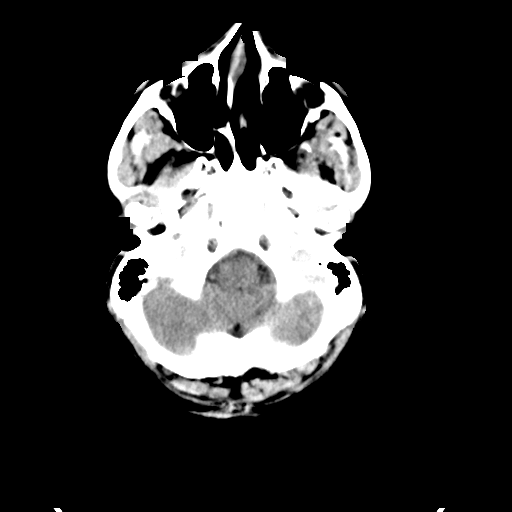
[im 5/35  bone]
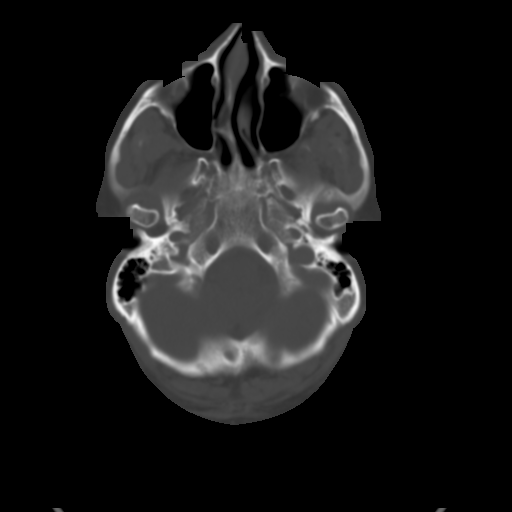
[im 9/35  brain]
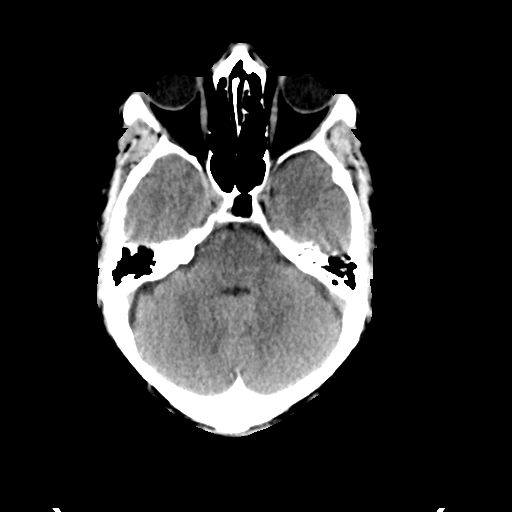
[im 13/35  brain]
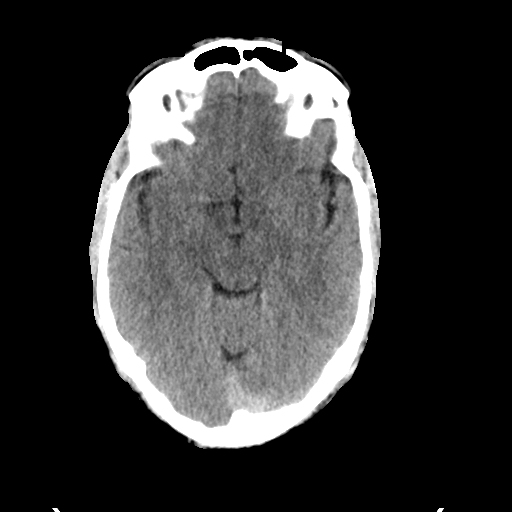
[im 18/35  brain]
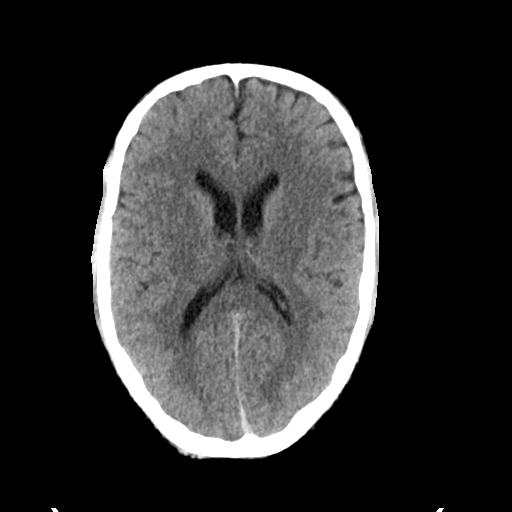
[im 22/35  brain]
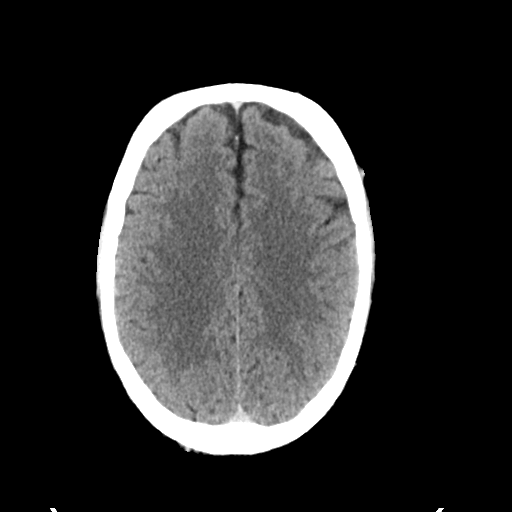
[im 22/35  bone]
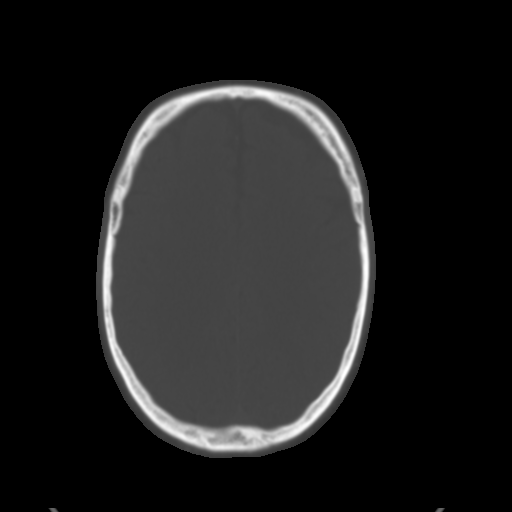
[im 26/35  brain]
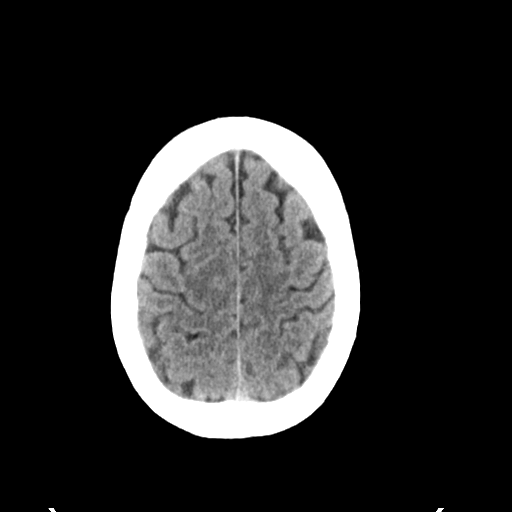
[im 30/35  brain]
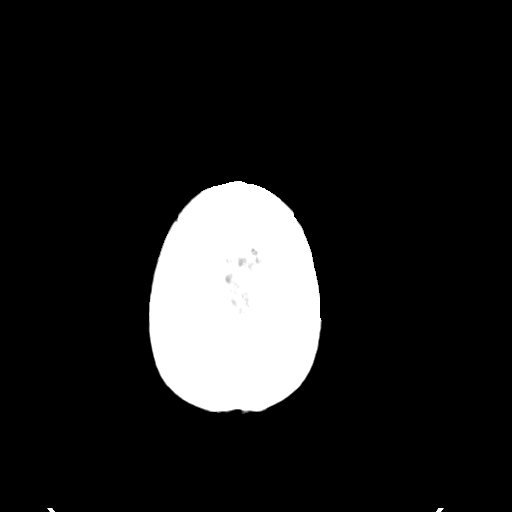

[Series 4: head bone · axial · 0.48mm/px · z∈[-74,-40]mm · 3 of 87 slices shown]
[im 9/87  bone]
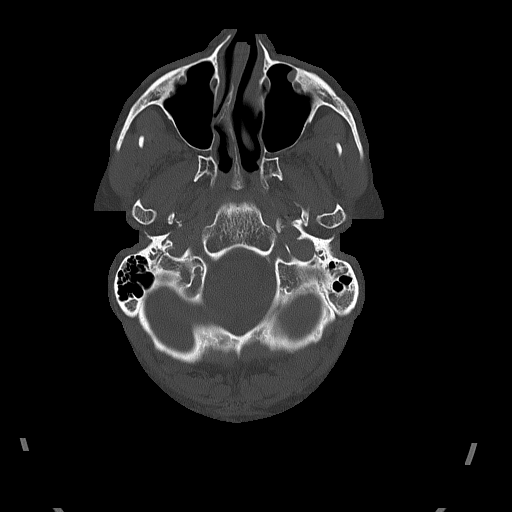
[im 18/87  bone]
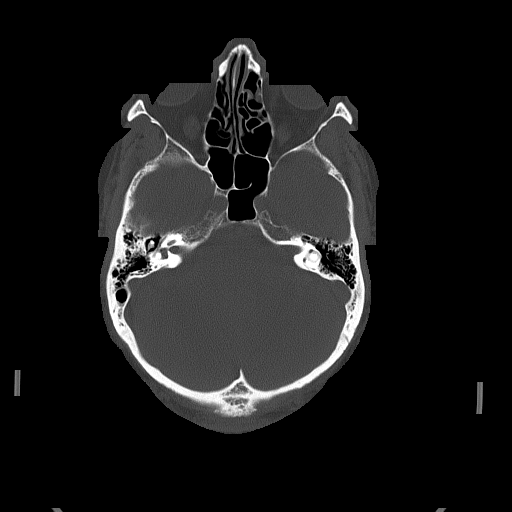
[im 26/87  bone]
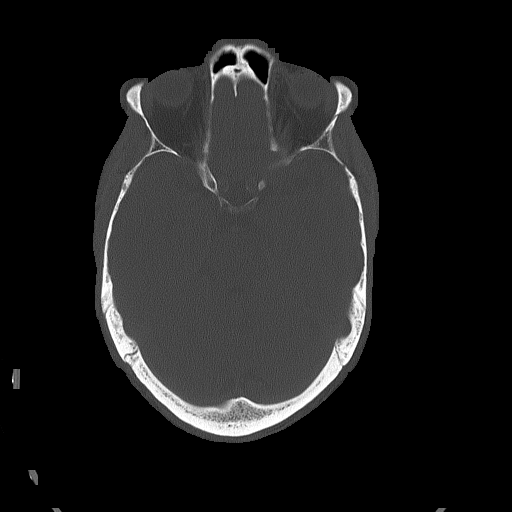

[Series 5: head without cor · coronal · non-contrast · 0.34mm/px · 3 of 75 slices shown]
[im 25/75  brain]
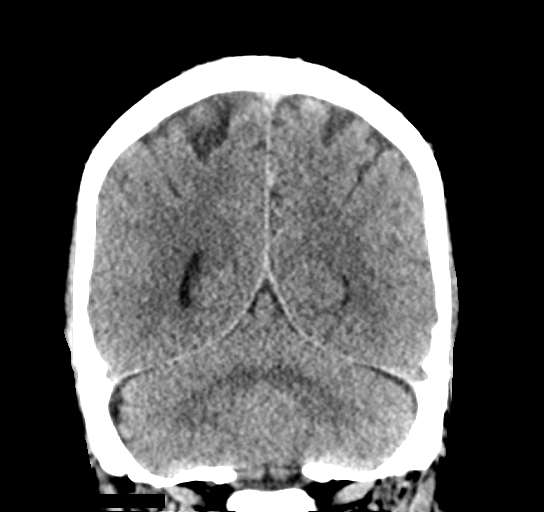
[im 33/75  brain]
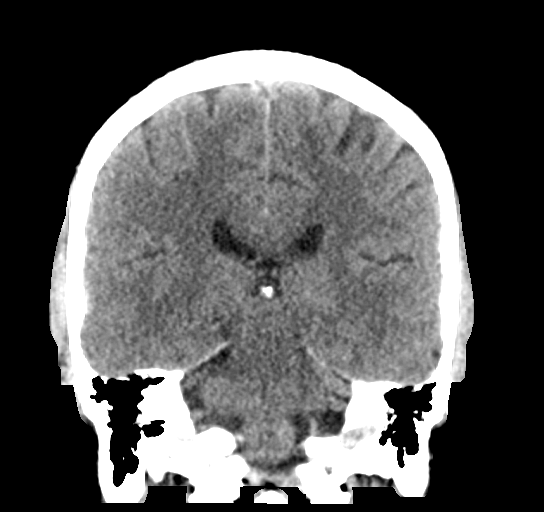
[im 42/75  brain]
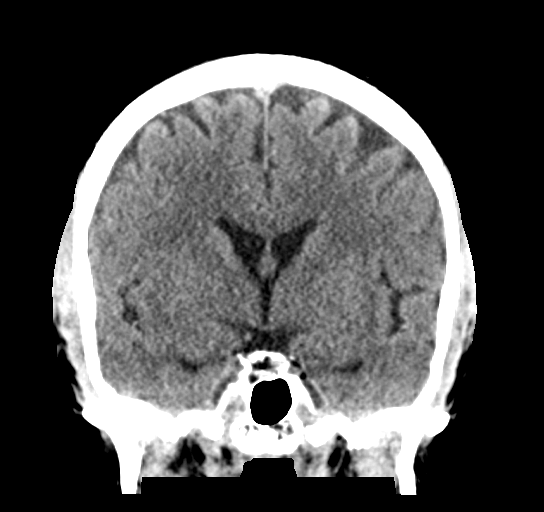

[Series 6: head without sag · sagittal · non-contrast · 0.34mm/px · 3 of 59 slices shown]
[im 20/59  brain]
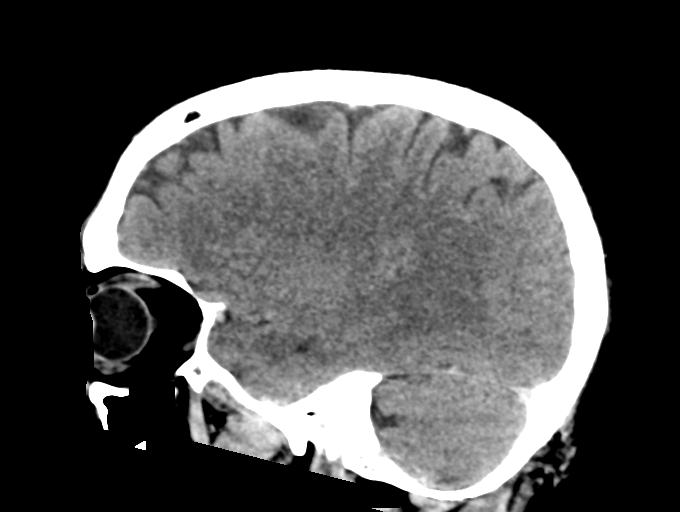
[im 30/59  brain]
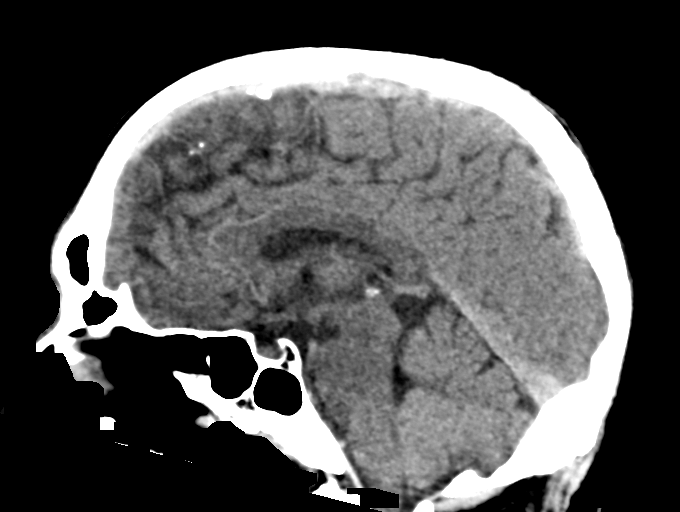
[im 39/59  brain]
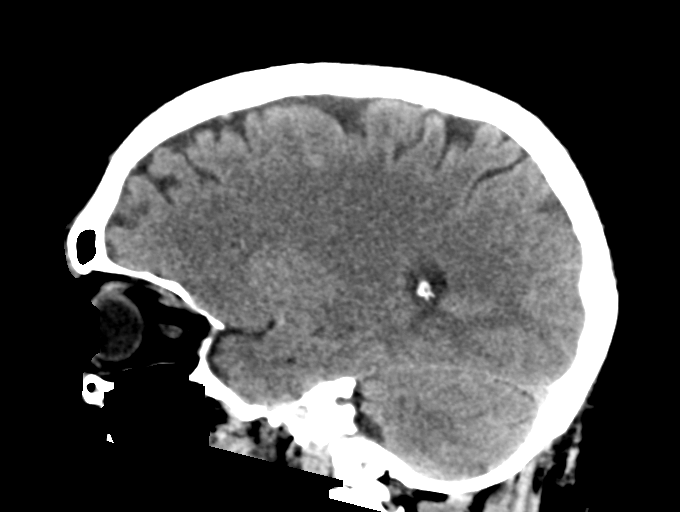

[16 of 47 positions shown; findings below may reference images not displayed]

FINDINGS: Brain: Ventricles and sulci are normal in size and configuration.
There is no intracranial mass, hemorrhage, extra-axial fluid
collection, or midline shift. The brain parenchyma appears
unremarkable. No evident acute infarct.

Vascular: No hyperdense vessel.  No evident vascular calcification.

Skull: Bony calvarium appears intact.

Sinuses/Orbits: There is opacification throughout much of the right
maxillary antrum. There is mucosal thickening in several ethmoid air
cells. There is a concha bullosa on the left, an anatomic variant.
There is rightward deviation of the nasal septum. Orbits appear
symmetric bilaterally.

Other: Mastoid air cells are clear.
IMPRESSION: Normal appearing brain parenchyma. No acute infarct. No mass or
hemorrhage.

There are foci paranasal sinus disease at several sites. Deviated
nasal septum.

## 2021-02-26 ENCOUNTER — Encounter: Payer: Self-pay | Admitting: Diagnostic Neuroimaging

## 2021-02-26 ENCOUNTER — Ambulatory Visit (INDEPENDENT_AMBULATORY_CARE_PROVIDER_SITE_OTHER): Payer: BC Managed Care – PPO | Admitting: Diagnostic Neuroimaging

## 2021-02-26 VITALS — BP 151/93 | HR 80 | Ht 70.5 in | Wt 217.0 lb

## 2021-02-26 DIAGNOSIS — R413 Other amnesia: Secondary | ICD-10-CM | POA: Diagnosis not present

## 2021-02-26 NOTE — Progress Notes (Signed)
[facility]  PATIENT: Douglas Neal DOB: Mar 13, 1966  REFERRING CLINICIAN: No ref. provider found HISTORY FROM: patient  REASON FOR VISIT: follow up   HISTORICAL  CHIEF COMPLAINT:  Chief Complaint  Patient presents with  . Memory Loss    Rm 6, FU requested, wife- Luster Landsberg   "word finding difficulty, difficulty keeping thoughts organized"  MMSE 30    HISTORY OF PRESENT ILLNESS:   UPDATE (02/26/21, VRP): Since last visit, continues to have intermittent word finding difficulties at home and at work.  No difficulty with comprehension or memory or other cognitive tasks.  Averaging 6 hours of sleep per night.  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  Symptoms noted by wife as well.  PRIOR HPI: 55 year old male here for evaluation of transient word finding difficulties and transient left-sided numbness.  Patient has had some several brief episodes of this over the past few weeks.  Episodes typically last just a few seconds at a time.  Patient went to the hospital for evaluation on 05/14/2020.  MRI and CTA of the head neck were unremarkable.  Patient has been having increased blood pressure lately.  He has followed up with cardiology and is on hydrochlorothiazide.  He has echocardiogram and stress test scheduled for tomorrow.  Patient has been under slightly more stress lately.  He averages 6 hours of sleep per night.    REVIEW OF SYSTEMS: Full 14 system review of systems performed and negative with exception of: As per HPI.  ALLERGIES: Allergies  Allergen Reactions  . Penicillins Other (See Comments)    Childhood  rash    HOME MEDICATIONS: Outpatient Medications Prior to Visit  Medication Sig Dispense Refill  . amLODipine (NORVASC) 5 MG tablet Take 1 tablet by mouth daily.    Marland Kitchen aspirin 81 MG chewable tablet Chew 1 tablet (81 mg total) by mouth daily. 30 tablet 1  . hydrochlorothiazide (HYDRODIURIL)  25 MG tablet Take 25 mg by mouth daily.    Marland Kitchen losartan (COZAAR) 50 MG tablet Take 1 tablet by mouth daily.    . sildenafil (REVATIO) 20 MG tablet Take 20 mg by mouth daily as needed (erectile dysfunction).     No facility-administered medications prior to visit.    PAST MEDICAL HISTORY: Past Medical History:  Diagnosis Date  . Hypertension     PAST SURGICAL HISTORY: No past surgical history on file.  FAMILY HISTORY: Family History  Problem Relation Age of Onset  . Lung cancer Mother   . Hypertension Mother   . Hypertension Father     SOCIAL HISTORY: Social History   Socioeconomic History  . Marital status: Married    Spouse name: Luster Landsberg  . Number of children: Not on file  . Years of education: Not on file  . Highest education level: Bachelor's degree (e.g., BA, AB, BS)  Occupational History  . Not on file  Tobacco Use  . Smoking status: Never Smoker  . Smokeless tobacco: Never Used  Substance and Sexual Activity  . Alcohol use: Yes    Comment: socially  . Drug use: Never  . Sexual activity: Not on file  Other Topics Concern  . Not on file  Social History Narrative   02/26/21 Lives with wife   Social Determinants of Health   Financial Resource Strain: Not on file  Food Insecurity: Not on file  Transportation Needs: Not on file  Physical Activity: Not on file  Stress:  Not on file  Social Connections: Not on file  Intimate Partner Violence: Not on file     PHYSICAL EXAM  GENERAL EXAM/CONSTITUTIONAL: Vitals:  Vitals:   02/26/21 1354  BP: (!) 151/93  Pulse: 80  Weight: 217 lb (98.4 kg)  Height: 5' 10.5" (1.791 m)   Body mass index is 30.7 kg/m. Wt Readings from Last 3 Encounters:  02/26/21 217 lb (98.4 kg)  06/04/20 214 lb (97.1 kg)  05/09/20 195 lb 1.7 oz (88.5 kg)    Patient is in no distress; well developed, nourished and groomed; neck is supple  CARDIOVASCULAR:  Examination of carotid arteries is normal; no carotid bruits  Regular rate  and rhythm, no murmurs  Examination of peripheral vascular system by observation and palpation is normal  EYES:  Ophthalmoscopic exam of optic discs and posterior segments is normal; no papilledema or hemorrhages No exam data present  MUSCULOSKELETAL:  Gait, strength, tone, movements noted in Neurologic exam below  NEUROLOGIC: MENTAL STATUS:  MMSE - Mini Mental State Exam 06/04/2020  Orientation to time 5  Orientation to Place 4  Registration 3  Attention/ Calculation 5  Recall 3  Language- name 2 objects 2  Language- repeat 1  Language- follow 3 step command 3  Language- read & follow direction 1  Write a sentence 1  Copy design 1  Total score 29    awake, alert, oriented to person, place and time  recent and remote memory intact  normal attention and concentration  language fluent, comprehension intact, naming intact  fund of knowledge appropriate  CRANIAL NERVE:   2nd - no papilledema on fundoscopic exam  2nd, 3rd, 4th, 6th - pupils equal and reactive to light, visual fields full to confrontation, extraocular muscles intact, no nystagmus  5th - facial sensation symmetric  7th - facial strength symmetric  8th - hearing intact  9th - palate elevates symmetrically, uvula midline  11th - shoulder shrug symmetric  12th - tongue protrusion midline  MOTOR:   normal bulk and tone, full strength in the BUE, BLE  SENSORY:   normal and symmetric to light touch, temperature, vibration  COORDINATION:   finger-nose-finger, fine finger movements normal  REFLEXES:   deep tendon reflexes present and symmetric  GAIT/STATION:   narrow based gait     DIAGNOSTIC DATA (LABS, IMAGING, TESTING) - I reviewed patient records, labs, notes, testing and imaging myself where available.  Lab Results  Component Value Date   WBC 4.6 05/14/2020   HGB 15.0 05/14/2020   HCT 44.0 05/14/2020   MCV 88.1 05/14/2020   PLT 206 05/14/2020      Component Value  Date/Time   NA 144 05/14/2020 1821   K 3.4 (L) 05/14/2020 1821   CL 99 05/14/2020 1821   CO2 27 05/14/2020 1811   GLUCOSE 106 (H) 05/14/2020 1821   BUN 26 (H) 05/14/2020 1821   CREATININE 0.90 05/14/2020 1821   CALCIUM 9.4 05/14/2020 1811   PROT 7.2 05/14/2020 1811   ALBUMIN 4.5 05/14/2020 1811   AST 19 05/14/2020 1811   ALT 34 05/14/2020 1811   ALKPHOS 83 05/14/2020 1811   BILITOT 0.6 05/14/2020 1811   GFRNONAA >60 05/14/2020 1811   GFRAA >60 05/14/2020 1811   No results found for: CHOL, HDL, LDLCALC, LDLDIRECT, TRIG, CHOLHDL No results found for: LNLG9Q Lab Results  Component Value Date   VITAMINB12 366 06/04/2020   Lab Results  Component Value Date   TSH 1.900 06/04/2020    05/09/20 CTA  head / neck - No large vessel occlusion, hemodynamically significant stenosis, or evidence of dissection.  05/09/20 MRI brain [I reviewed images myself and agree with interpretation. -VRP]  - No evidence of recent infarction, hemorrhage, or mass.   ASSESSMENT AND PLAN  55 y.o. year old male here with:  Dx:  1. Memory loss      PLAN:  INTERMITENT WORD FINDING DIFFICULTY - daily physical activity / exercise (at least 15-30 minutes) - eat more plants / vegetables - increase social activities, brain stimulation, games, puzzles, hobbies, crafts, arts, music - aim for at least 7-8 hours sleep per night (or more) - avoid smoking and alcohol  SNORING / FATIGUE / MEMORY - check sleep apnea   Orders Placed This Encounter  Procedures  . Ambulatory referral to Sleep Studies    Return for pending if symptoms worsen or fail to improve.    Suanne Marker, MD 02/26/2021, 2:15 PM Certified in Neurology, Neurophysiology and Neuroimaging  [facility] [address] [place], [place] [address] [phone]

## 2021-04-30 ENCOUNTER — Other Ambulatory Visit: Payer: Self-pay

## 2021-04-30 ENCOUNTER — Ambulatory Visit (INDEPENDENT_AMBULATORY_CARE_PROVIDER_SITE_OTHER): Payer: BC Managed Care – PPO | Admitting: Neurology

## 2021-04-30 ENCOUNTER — Encounter: Payer: Self-pay | Admitting: Neurology

## 2021-04-30 VITALS — BP 135/81 | HR 58 | Ht 70.0 in | Wt 216.3 lb

## 2021-04-30 DIAGNOSIS — R0683 Snoring: Secondary | ICD-10-CM | POA: Diagnosis not present

## 2021-04-30 DIAGNOSIS — E669 Obesity, unspecified: Secondary | ICD-10-CM

## 2021-04-30 DIAGNOSIS — R413 Other amnesia: Secondary | ICD-10-CM

## 2021-04-30 DIAGNOSIS — R419 Unspecified symptoms and signs involving cognitive functions and awareness: Secondary | ICD-10-CM

## 2021-04-30 DIAGNOSIS — R351 Nocturia: Secondary | ICD-10-CM

## 2021-04-30 NOTE — Patient Instructions (Signed)
It was nice to meet you today. Based on your symptoms and your exam I believe you are at risk for obstructive sleep apnea (aka OSA), and I think we should proceed with a sleep study to determine whether you do or do not have OSA and how severe it is. Even, if you have mild OSA, I may want you to consider treatment with CPAP, as treatment of even borderline or mild sleep apnea can result and improvement of symptoms such as sleep disruption, daytime sleepiness, nighttime bathroom breaks, restless leg symptoms, improvement of headache syndromes, even improved mood disorder.   As explained, an attended sleep study meaning you get to stay overnight in the sleep lab, lets us monitor sleep-related behaviors such as sleep talking and leg movements in sleep, in addition to monitoring for sleep apnea.  A home sleep test is a screening tool for sleep apnea only, and unfortunately does not help with any other sleep-related diagnoses.  Please remember, the long-term risks and ramifications of untreated moderate to severe obstructive sleep apnea are: increased Cardiovascular disease, including congestive heart failure, stroke, difficult to control hypertension, treatment resistant obesity, arrhythmias, especially irregular heartbeat commonly known as A. Fib. (atrial fibrillation); even type 2 diabetes has been linked to untreated OSA.   Sleep apnea can cause disruption of sleep and sleep deprivation in most cases, which, in turn, can cause recurrent headaches, problems with memory, mood, concentration, focus, and vigilance. Most people with untreated sleep apnea report excessive daytime sleepiness, which can affect their ability to drive. Please do not drive if you feel sleepy. Patients with sleep apnea can also develop difficulty initiating and maintaining sleep (aka insomnia).   Having sleep apnea may increase your risk for other sleep disorders, including involuntary behaviors sleep such as sleep terrors, sleep  talking, sleepwalking.    Having sleep apnea can also increase your risk for restless leg syndrome and leg movements at night.   Please note that untreated obstructive sleep apnea may carry additional perioperative morbidity. Patients with significant obstructive sleep apnea (typically, in the moderate to severe degree) should receive, if possible, perioperative PAP (positive airway pressure) therapy and the surgeons and particularly the anesthesiologists should be informed of the diagnosis and the severity of the sleep disordered breathing.   I will likely see you back after your sleep study to go over the test results and where to go from there. We will call you after your sleep study to advise about the results (most likely, you will hear from Megan, my nurse) and to set up an appointment at the time, as necessary.    Our sleep lab administrative assistant will call you to schedule your sleep study and give you further instructions, regarding the check in process for the sleep study, arrival time, what to bring, when you can expect to leave after the study, etc., and to answer any other logistical questions you may have. If you don't hear back from her by about 2 weeks from now, please feel free to call her direct line at [phone] or you can call our general clinic number, or email us through My Chart.    

## 2021-04-30 NOTE — Progress Notes (Signed)
Subjective:    Patient ID: Douglas Neal is a 55 y.o. male.  HPI    Huston Foley, MD, PhD [facility] [address] [address] [place], [place] [address]  Dear Douglas Neal,   I saw your patient, Douglas Neal, upon your kind request in my sleep clinic today for initial consultation of his sleep disturbances, in particular, concern for underlying obstructive sleep apnea.  The patient is unaccompanied today.  As you know, [name] is a 55 year old right-handed gentleman with an underlying medical history of hypertension, memory loss, and borderline obesity, who reports snoring and some sleep disruption, nonrestorative sleep.  He feels that he averages about 6 hours of sleep.  He has nocturia about once per average night, denies recurrent morning headaches.  He has had cognitive complaints for the past nearly 1 year.  He has recently seen a cardiologist and is currently on carvedilol.  He stopped taking his amlodipine, hydrochlorothiazide and losartan some 3 months ago.  He is not aware of any family history of sleep apnea.  He had a tonsillectomy as a child.  Weight is plus minus stable.  He drinks caffeine in the form of coffee, typically 1 cup in the morning, alcohol up to 4 times a week, typically 1 glass of wine.  He is a non-smoker.  He lives with his wife.  He works as a Arts administrator.  Bedtime is generally around 10:30 PM and rise time between 6 and 7 AM.  And excessive daytime somnolence.  I reviewed your office note from 02/26/2021.  His Epworth sleepiness score is 3 out of 24, fatigue severity score is 15 out of 63.  His Past Medical History Is Significant For: Past Medical History:  Diagnosis Date   Hypertension     His Past Surgical History Is Significant For: History reviewed. No pertinent surgical history.  His Family History Is Significant For: Family History  Problem Relation Age of Onset   Lung cancer Mother    Hypertension  Mother    Hypertension Father     His Social History Is Significant For: Social History   Socioeconomic History   Marital status: Married    Spouse name: Douglas Neal   Number of children: Not on file   Years of education: Not on file   Highest education level: Bachelor's degree (e.g., BA, AB, BS)  Occupational History   Not on file  Tobacco Use   Smoking status: Never   Smokeless tobacco: Never  Substance and Sexual Activity   Alcohol use: Yes    Comment: socially   Drug use: Never   Sexual activity: Not on file  Other Topics Concern   Not on file  Social History Narrative   02/26/21 Lives with wife   Social Determinants of Health   Financial Resource Strain: Not on file  Food Insecurity: Not on file  Transportation Needs: Not on file  Physical Activity: Not on file  Stress: Not on file  Social Connections: Not on file    His Allergies Are:  Allergies  Allergen Reactions   Penicillins Other (See Comments)    Childhood  rash  :   His Current Medications Are:  Outpatient Encounter Medications as of 04/30/2021  Medication Sig   amLODipine (NORVASC) 5 MG tablet Take 1 tablet by mouth daily.   aspirin 81 MG chewable tablet Chew 1 tablet (81 mg total) by mouth daily.   carvedilol (COREG) 6.25 MG tablet Take 6.25 mg by mouth  2 (two) times daily.   hydrochlorothiazide (HYDRODIURIL) 25 MG tablet Take 25 mg by mouth daily.   sildenafil (REVATIO) 20 MG tablet Take 20 mg by mouth daily as needed (erectile dysfunction).   [DISCONTINUED] losartan (COZAAR) 50 MG tablet Take 1 tablet by mouth daily.   No facility-administered encounter medications on file as of 04/30/2021.  :   Review of Systems:  Out of a complete 14 point review of systems, all are reviewed and negative with the exception of these symptoms as listed below:  Review of Systems  Neurological:        Here for sleep consult. No prior sleep sutdy, reports memory fog and snoring is present.  Epworth Sleepiness  Scale 0= would never doze 1= slight chance of dozing 2= moderate chance of dozing 3= high chance of dozing  Sitting and reading:0 Watching TV:1 Sitting inactive in a public place (ex. Theater or meeting):0 As a passenger in a car for an hour without a break:0 Lying down to rest in the afternoon:1 Sitting and talking to someone:0 Sitting quietly after lunch (no alcohol):0 In a car, while stopped in traffic:0 Total:2    Objective:  Neurological Exam  Physical Exam Physical Examination:   Vitals:   04/30/21 0914  BP: 135/81  Pulse: (!) 58    General Examination: The patient is a very pleasant 55 y.o. male in no acute distress. He appears well-developed and well-nourished and well groomed.   HEENT: Normocephalic, atraumatic, pupils are equal, round and reactive to light, extraocular tracking is good without limitation to gaze excursion or nystagmus noted. Hearing is grossly intact. Face is symmetric with normal facial animation. Speech is clear with no dysarthria noted. There is no hypophonia. There is no lip, neck/head, jaw or voice tremor. Neck is supple with full range of passive and active motion. There are no carotid bruits on auscultation. Oropharynx exam reveals: No significant mouth dryness, good dental hygiene, airway crowding is in the mild-to-moderate range secondary to larger uvula.  Mallampati class III.  Neck circumference of 16-7/8 inches.  Tonsils absent.  Tongue protrudes centrally and palate elevates symmetrically, he has no significant overbite, maybe minimal.   Chest: Clear to auscultation without wheezing, rhonchi or crackles noted.  Heart: S1+S2+0, regular and normal without murmurs, rubs or gallops noted.   Abdomen: Soft, non-tender and non-distended.  Extremities: There is no pitting edema in the distal lower extremities bilaterally.   Skin: Warm and dry without trophic changes noted.   Musculoskeletal: exam reveals no obvious joint deformities.    Neurologically:  Mental status: The patient is awake, alert and oriented in all 4 spheres. His immediate and remote memory, attention, language skills and fund of knowledge are appropriate. There is no evidence of aphasia, agnosia, apraxia or anomia. Speech is clear with normal prosody and enunciation. Thought process is linear. Mood is normal and affect is normal.  Cranial nerves II - XII are as described above under HEENT exam.  Motor exam: Normal bulk, strength and tone is noted. There is no tremor, Romberg is negative. Fine motor skills and coordination: grossly intact.  Cerebellar testing: No dysmetria or intention tremor. There is no truncal or gait ataxia.  Sensory exam: intact to light touch in the upper and lower extremities.  Gait, station and balance: He stands easily. No veering to one side is noted. No leaning to one side is noted. Posture is age-appropriate and stance is narrow based. Gait shows normal stride length and normal pace. No problems  turning are noted. Tandem walk is unremarkable.                Assessment and Plan:  In summary, KEYONDRE HEPBURN is a very pleasant 55 y.o.-year old male with an underlying medical history of hypertension, memory loss, and borderline obesity, whose history and physical exam are concerning for obstructive sleep apnea (OSA). I had a long chat with the patient about my findings and the diagnosis of OSA, its prognosis and treatment options. We talked about medical treatments, surgical interventions and non-pharmacological approaches. I explained in particular the risks and ramifications of untreated moderate to severe OSA, especially with respect to developing cardiovascular disease down the Road, including congestive heart failure, difficult to treat hypertension, cardiac arrhythmias, or stroke. Even type 2 diabetes has, in part, been linked to untreated OSA. Symptoms of untreated OSA include daytime sleepiness, memory problems, mood irritability  and mood disorder such as depression and anxiety, lack of energy, as well as recurrent headaches, especially morning headaches. We talked about trying to maintain a  healthy lifestyle in general, as well as the importance of weight control. We also talked about the importance of good sleep hygiene. I recommended the following at this time: sleep study.   I explained the sleep test procedure to the patient and also outlined possible surgical and non-surgical treatment options of OSA, including the use of a custom-made dental device (which would require a referral to a specialist dentist or oral surgeon), upper airway surgical options, such as traditional UPPP or a novel less invasive surgical option in the form of Inspire hypoglossal nerve stimulation (which would involve a referral to an ENT surgeon). I also explained the CPAP treatment option to the patient, who indicated that he would be willing to try CPAP if the need arises.  We will pick up our discussion after testing.  I answered all his questions today and he was in agreement.  We will plan a follow-up in sleep clinic accordingly.    Thank you very much for allowing me to participate in the care of this nice patient. If I can be of any further assistance to you please do not hesitate to talk to me.  Sincerely,   Huston Foley, MD, PhD

## 2021-05-12 ENCOUNTER — Telehealth: Payer: Self-pay

## 2021-05-12 NOTE — Telephone Encounter (Signed)
LVM for pt to call me back to schedule sleep study  

## 2021-06-02 ENCOUNTER — Ambulatory Visit (INDEPENDENT_AMBULATORY_CARE_PROVIDER_SITE_OTHER): Payer: BC Managed Care – PPO | Admitting: Neurology

## 2021-06-02 DIAGNOSIS — R419 Unspecified symptoms and signs involving cognitive functions and awareness: Secondary | ICD-10-CM

## 2021-06-02 DIAGNOSIS — G4733 Obstructive sleep apnea (adult) (pediatric): Secondary | ICD-10-CM

## 2021-06-02 DIAGNOSIS — E669 Obesity, unspecified: Secondary | ICD-10-CM

## 2021-06-02 DIAGNOSIS — R351 Nocturia: Secondary | ICD-10-CM

## 2021-06-02 DIAGNOSIS — R413 Other amnesia: Secondary | ICD-10-CM

## 2021-06-02 DIAGNOSIS — R0683 Snoring: Secondary | ICD-10-CM

## 2021-06-04 NOTE — Progress Notes (Signed)
See procedure note.

## 2021-06-05 NOTE — Procedures (Signed)
   [facility]  HOME SLEEP TEST (Watch PAT) REPORT  STUDY DATE: 06/02/2021  DOB: 1966/05/23  MRN: [id number]  ORDERING CLINICIAN: Huston Foley, MD, PhD   REFERRING CLINICIAN: Dr. Marjory Lies  CLINICAL INFORMATION/HISTORY: 55 year old right-handed gentleman with an underlying medical history of hypertension, memory loss, and borderline obesity, who reports snoring and some sleep disruption, nonrestorative sleep.    Epworth sleepiness score: 3/24.  BMI: 30.9 kg/m  FINDINGS:   Sleep Summary:   Total Recording Time (hours, min): 9 hours, 0 minutes  Total Sleep Time (hours, min):  7 hours, 53 minutes   Percent REM (%):    18.7%   Respiratory Indices:   Calculated pAHI (per hour):  39/hour         REM pAHI:    28/hour       NREM pAHI: 41.6/hour  Oxygen Saturation Statistics:    Oxygen Saturation (%) Mean: 94%   Minimum oxygen saturation (%):                 88%   O2 Saturation Range (%): 88-99%    O2 Saturation (minutes) <=88%: 0.1 min  Pulse Rate Statistics:   Pulse Mean (bpm):    53/min    Pulse Range (40-80/min)   IMPRESSION: OSA (obstructive sleep apnea)   RECOMMENDATION:  This home sleep test demonstrates moderate obstructive sleep apnea -by number of events- with a total AHI of 39/hour and O2 nadir of 88%. Treatment with positive airway pressure is recommended. The patient will be advised to proceed with an autoPAP titration/trial at home for now. A full night titration study may be considered to optimize treatment settings, if needed down the road. Please note that untreated obstructive sleep apnea may carry additional perioperative morbidity. Patients with significant obstructive sleep apnea should receive perioperative PAP therapy and the surgeons and particularly the anesthesiologist should be informed of the diagnosis and the severity of the sleep disordered breathing.  Alternative treatment options may include a dental device or surgical  options if positive airway pressure treatment is not successful or if tolerated.  Weight loss is encouraged. The patient should be cautioned not to drive, work at heights, or operate dangerous or heavy equipment when tired or sleepy. Review and reiteration of good sleep hygiene measures should be pursued with any patient. Other causes of the patient's symptoms, including circadian rhythm disturbances, an underlying mood disorder, medication effect and/or an underlying medical problem cannot be ruled out based on this test. Clinical correlation is recommended. The patient and his referring provider will be notified of the test results. The patient will be seen in follow up in sleep clinic at [facility].  I certify that I have reviewed the raw data recording prior to the issuance of this report in accordance with the standards of the American Academy of Sleep Medicine (AASM).   INTERPRETING PHYSICIAN:   Huston Foley, MD, PhD  Board Certified in Neurology and Sleep Medicine  [facility] [address], [address] [place], [place] [address] [phone]

## 2021-06-05 NOTE — Addendum Note (Signed)
Addended by: Huston Foley on: 06/05/2021 05:39 PM   Modules accepted: Orders

## 2021-06-11 ENCOUNTER — Telehealth: Payer: Self-pay | Admitting: *Deleted

## 2021-06-11 NOTE — Telephone Encounter (Signed)
Called pt and LVM asking for call back.  

## 2021-06-11 NOTE — Telephone Encounter (Signed)
-----   Message from Huston Foley, MD sent at 06/05/2021  5:39 PM EDT ----- Patient referred by Dr. Marjory Lies, seen by me on 04/30/2021, patient had a HST on 06/02/2021.    Please call and notify the patient that the recent home sleep test showed obstructive sleep apnea in the moderate range. I recommend treatment in the form of autoPAP, which means, that we don't have to bring him in for a sleep study with CPAP, but will let him start using a so called autoPAP machine at home, which is a CPAP-like machine with self-adjusting pressures. We will send the order to a local DME company (of his choice, or as per insurance requirement). The DME representative will fit him with a mask, educate him on how to use the machine, how to put the mask on, etc. I have placed an order in the chart. Please send the order, talk to patient, send report to referring MD. We will need a FU in sleep clinic for 10 weeks post-PAP set up, please arrange that with me or one of our NPs. Also reinforce the need for compliance with treatment. Thanks,   Huston Foley, MD, PhD [facility])

## 2021-06-12 ENCOUNTER — Telehealth: Payer: Self-pay | Admitting: *Deleted

## 2021-06-12 NOTE — Telephone Encounter (Signed)
-----   Message from Saima Athar, MD sent at 06/05/2021  5:39 PM EDT ----- Patient referred by Dr. Penumalli, seen by me on 04/30/2021, patient had a HST on 06/02/2021.    Please call and notify the patient that the recent home sleep test showed obstructive sleep apnea in the moderate range. I recommend treatment in the form of autoPAP, which means, that we don't have to bring him in for a sleep study with CPAP, but will let him start using a so called autoPAP machine at home, which is a CPAP-like machine with self-adjusting pressures. We will send the order to a local DME company (of his choice, or as per insurance requirement). The DME representative will fit him with a mask, educate him on how to use the machine, how to put the mask on, etc. I have placed an order in the chart. Please send the order, talk to patient, send report to referring MD. We will need a FU in sleep clinic for 10 weeks post-PAP set up, please arrange that with me or one of our NPs. Also reinforce the need for compliance with treatment. Thanks,   Saima Athar, MD, PhD [facility] ([facility])     

## 2021-06-12 NOTE — Telephone Encounter (Signed)
I called the patient to discuss sleep test results. Agreeable to recommended treatment.   Understanding was verbalized of the following:  1) expect call from DME company 2) importance of being compliant with use 3) call to schedule 10 week f/u once machine is delivered to meet insurance requirements  Orders sent to [facility].

## 2021-08-13 ENCOUNTER — Encounter: Payer: Self-pay | Admitting: Neurology

## 2021-08-13 ENCOUNTER — Telehealth: Payer: Self-pay | Admitting: *Deleted

## 2021-08-13 DIAGNOSIS — G4733 Obstructive sleep apnea (adult) (pediatric): Secondary | ICD-10-CM

## 2021-08-13 NOTE — Telephone Encounter (Signed)
Pt brought his sd card to office for a DL/ RESMED.  Set up dat 06-30-21 has f/u 09-01-21.  Has used only for 6 days .

## 2021-08-13 NOTE — Telephone Encounter (Signed)
Please see mychart encounter

## 2021-08-13 NOTE — Telephone Encounter (Signed)
I reviewed patient's AutoPap compliance data.  Patient has used his machine 6 days since set up on 06/30/2021.  I recommend that he continue to try using AutoPap, I would like to suggest a pressure reduction which may help with tolerance.  I have placed an order for pressure reduction, please advise patient and send the order to his DME company.  He may also want to try melatonin at night for sleep, about 3 to 5 mg about an hour or 2 before bedtime.  It is over-the-counter.

## 2021-08-13 NOTE — Telephone Encounter (Signed)
Done see other note 

## 2021-08-14 NOTE — Telephone Encounter (Signed)
Ross Ludwig, RN got it!  From [facility].

## 2021-08-27 ENCOUNTER — Encounter: Payer: Self-pay | Admitting: *Deleted

## 2021-09-01 ENCOUNTER — Ambulatory Visit: Payer: BC Managed Care – PPO | Admitting: Neurology

## 2021-09-02 NOTE — Telephone Encounter (Signed)
Called patient and LVM (ok per DPR) asking for call back ASAP to let us know if 09/08/2021 at 10:45 AM works for him for an appt.

## 2021-09-08 ENCOUNTER — Ambulatory Visit: Payer: BC Managed Care – PPO | Admitting: Adult Health

## 2021-09-11 NOTE — Telephone Encounter (Signed)
I reviewed patient's AutoPap compliance data for the past 11 days.  He has been fully compliant, apnea scores look good, average usage 6 hours and 8 minutes, average pressure for the 95th percentile 10.4 cm and leak acceptable with a 95th percentile at 8.5 L/min.  Please ask patient to be consistent with his usage and follow-up as scheduled.  As far as insurance compliance criteria, he may have to check with his DME company about how strict his insurance is about compliance and meeting criteria within the first 90 days.

## 2021-09-11 NOTE — Telephone Encounter (Signed)
Addressed in other mychart encounter

## 2021-09-11 NOTE — Telephone Encounter (Signed)
Patient brought his SD card our office today and we performed a download.  Report has been given to Dr. Frances Furbish to review.  Looks like the patient has been doing better with his compliance over the last 11 days.  He has total usage of 17 days, a few at the end of August and then the rest most recent.  The patient was encouraged to continue using his machine and he also will need a follow-up visit scheduled for initial CPAP compliance.  I advised he can call [employer] to see if his insurance would be flexible with the appointment.

## 2021-09-15 NOTE — Telephone Encounter (Signed)
We will need to find an appt November 29th through the first week of December. Per pt's DME, he should be ok if his initial f/u is a little over 90 days, so the first week of December should be ok.

## 2021-09-30 NOTE — Telephone Encounter (Signed)
Called patient and left voicemail advising we were unable to pull the data from the email.  We do need to have his card directly upload the data.  I asked for a call back ASAP to get him scheduled for an appointment.  We still have 830 open tomorrow morning but we also have Monday, December 5 at 8:30 AM.  Left office number for call back.

## 2021-10-01 NOTE — Telephone Encounter (Signed)
Sitting and reading: 1 Watching TV: 2 Sitting inactive in a public place (ex. Theater or meeting):1 As a passenger in a car for an hour without a break:1 Lying down to rest in the afternoon: 2 Sitting and talking to someone:1 Sitting quietly after lunch (no alcohol):1 In a car, while stopped in traffic:0  Total: 9

## 2021-10-01 NOTE — Progress Notes (Deleted)
Epworth Sleepiness Scale 0= would never doze 1= slight chance of dozing 2= moderate chance of dozing 3= high chance of dozing  Sitting and reading: 1 Watching TV: 2 Sitting inactive in a public place (ex. Theater or meeting):1 As a passenger in a car for an hour without a break:1 Lying down to rest in the afternoon: 2 Sitting and talking to someone:1 Sitting quietly after lunch (no alcohol):1 In a car, while stopped in traffic:0   Total: 9

## 2021-10-05 ENCOUNTER — Encounter: Payer: Self-pay | Admitting: Adult Health

## 2021-10-06 ENCOUNTER — Ambulatory Visit (INDEPENDENT_AMBULATORY_CARE_PROVIDER_SITE_OTHER): Payer: BC Managed Care – PPO | Admitting: Adult Health

## 2021-10-06 ENCOUNTER — Encounter: Payer: Self-pay | Admitting: Adult Health

## 2021-10-06 ENCOUNTER — Other Ambulatory Visit: Payer: Self-pay

## 2021-10-06 ENCOUNTER — Ambulatory Visit: Payer: BC Managed Care – PPO | Admitting: Adult Health

## 2021-10-06 VITALS — BP 132/76 | HR 57 | Ht 71.0 in | Wt 222.0 lb

## 2021-10-06 DIAGNOSIS — Z9989 Dependence on other enabling machines and devices: Secondary | ICD-10-CM

## 2021-10-06 DIAGNOSIS — G4733 Obstructive sleep apnea (adult) (pediatric): Secondary | ICD-10-CM

## 2021-10-06 NOTE — Progress Notes (Signed)
[facility] [address] [place]. [place] [address] [phone]       OFFICE FOLLOW UP NOTE  Douglas Neal Date of Birth:  October 21, 1966 Medical Record Number:  [id number]   Sleep neurologist: Dr. Frances Furbish Reason for visit: CPAP follow-up    SUBJECTIVE:   CHIEF COMPLAINT:  Chief Complaint  Patient presents with   Follow-up    Rm 3 here for f/u for initial CPAP- Pt reports he has been doing well on the machine. No complaints.     HPI:   Douglas Neal is a 55 y.o. male evaluated by Dr. Frances Furbish on 04/30/2021 with concerns of underlying sleep apnea with PMHx of HTN, memory loss and borderline obesity with complaints of snoring and some sleep disruption with nonrestorative sleep.  Underwent HST 06/02/2021 which showed moderate OSA with a total AHI of 39/h and O2 nadir of 88% and recommend initiating AutoPap with consideration of titration study if indicated in the future. AutoPAP started on 06/30/2021.    Review of compliance report from 08/06/2021 -10/05/2021 shows 27 out of 30 usage days with 27 days greater than 4 hours with 6 hours and 1 minute average usage.  Residual AHI 2.8 with mean pressure 6 and max pressure 12 with EPR level 2.  Pressure in the 95th percentile 10.1 and leaks in the 95th percentile 6.1.  Reports doing well with CPAP.  He was initially started with fullface mask of personally purchased ResMed N30 nasal cradle mask which he prefers to use over full face mask.  Epworth Sleepiness Scale 7/24.  Fatigue severity scale 21/63.  Returns today for initial CPAP compliance visit.    Consult visit 04/30/2021 Dr. Frances Furbish (copied for reference purposes only) Douglas Neal is a 55 year old right-handed gentleman with an underlying medical history of hypertension, memory loss, and borderline obesity, who reports snoring and some sleep disruption, nonrestorative sleep.  He feels that he averages about 6 hours of sleep.  He has nocturia about once per average  night, denies recurrent morning headaches.  He has had cognitive complaints for the past nearly 1 year.  He has recently seen a cardiologist and is currently on carvedilol.  He stopped taking his amlodipine, hydrochlorothiazide and losartan some 3 months ago.  He is not aware of any family history of sleep apnea.  He had a tonsillectomy as a child.  Weight is plus minus stable.  He drinks caffeine in the form of coffee, typically 1 cup in the morning, alcohol up to 4 times a week, typically 1 glass of wine.  He is a non-smoker.  He lives with his wife.  He works as a Arts administrator.  Bedtime is generally around 10:30 PM and rise time between 6 and 7 AM.  And excessive daytime somnolence.  I reviewed your office note from 02/26/2021.  His Epworth sleepiness score is 3 out of 24, fatigue severity score is 15 out of 63.    ROS:   14 system review of systems performed and negative with exception of no complaints  PMH:  Past Medical History:  Diagnosis Date   Hypertension     PSH: No past surgical history on file.  Social History:  Social History   Socioeconomic History   Marital status: Married    Spouse name: Douglas Neal   Number of children: Not on file   Years of education: Not on file   Highest education level: Bachelor's degree (e.g., BA, AB, BS)  Occupational History   Not  on file  Tobacco Use   Smoking status: Never   Smokeless tobacco: Never  Substance and Sexual Activity   Alcohol use: Yes    Comment: socially   Drug use: Never   Sexual activity: Not on file  Other Topics Concern   Not on file  Social History Narrative   02/26/21 Lives with wife   Social Determinants of Health   Financial Resource Strain: Not on file  Food Insecurity: Not on file  Transportation Needs: Not on file  Physical Activity: Not on file  Stress: Not on file  Social Connections: Not on file  Intimate Partner Violence: Not on file    Family History:  Family History  Problem Relation Age of  Onset   Lung cancer Mother    Hypertension Mother    Hypertension Father     Medications:   Current Outpatient Medications on File Prior to Visit  Medication Sig Dispense Refill   amLODipine (NORVASC) 5 MG tablet Take 1 tablet by mouth daily.     aspirin 81 MG chewable tablet Chew 1 tablet (81 mg total) by mouth daily. 30 tablet 1   carvedilol (COREG) 6.25 MG tablet Take 6.25 mg by mouth 2 (two) times daily.     hydrochlorothiazide (HYDRODIURIL) 25 MG tablet Take 25 mg by mouth daily.     sildenafil (REVATIO) 20 MG tablet Take 20 mg by mouth daily as needed (erectile dysfunction).     No current facility-administered medications on file prior to visit.    Allergies:   Allergies  Allergen Reactions   Penicillins Other (See Comments)    Childhood  rash      OBJECTIVE:  Physical Exam  Vitals:   10/06/21 0812  BP: 132/76  Pulse: (!) 57  SpO2: 97%  Weight: 222 lb (100.7 kg)  Height: 5\' 11"  (1.803 m)   Body mass index is 30.96 kg/m. No results found.  General: well developed, well nourished, very pleasant middle aged Caucasian male, seated, in no evident distress Head: head normocephalic and atraumatic.   Neck: supple with no carotid or supraclavicular bruits Cardiovascular: regular rate and rhythm, no murmurs Musculoskeletal: no deformity Skin:  no rash/petichiae Vascular:  Normal pulses all extremities   Neurologic Exam Mental Status: Awake and fully alert. Oriented to place and time. Recent and remote memory intact. Attention span, concentration and fund of knowledge appropriate. Mood and affect appropriate.  Cranial Nerves: Pupils equal, briskly reactive to light. Extraocular movements full without nystagmus. Visual fields full to confrontation. Hearing intact. Facial sensation intact. Face, tongue, palate moves normally and symmetrically.  Motor: Normal bulk and tone. Normal strength in all tested extremity muscles Sensory.: intact to touch , pinprick , position  and vibratory sensation.  Coordination: Rapid alternating movements normal in all extremities. Finger-to-nose and heel-to-shin performed accurately bilaterally. Gait and Station: Arises from chair without difficulty. Stance is normal. Gait demonstrates normal stride length and balance without use of AD.  Reflexes: 1+ and symmetric. Toes downgoing.         ASSESSMENT/PLAN: Douglas Neal is a 55 y.o. year old male followed by Dr. 59 with recent diagnosis of moderate OSA per HST on 06/02/2021 and initiated AutoPap on 06/30/2021.    1. OSA on CPAP -continue current settings 6-12 cwp -request change of mask from fullface to N30 nasal cradle as he has tolerated this mask better -Continue to follow with DME company for any needed supplies or CPAP related concerns    Orders Placed This Encounter  Procedures   For home use only DME continuous positive airway pressure (CPAP)    Please change mask to ResMed N30 nasal cradle mask Continue current settings with min pressure 6 and max pressure 12 with EPR level 2    Order Specific Question:   Length of Need    Answer:   Lifetime    Order Specific Question:   Patient has OSA or probable OSA    Answer:   Yes    Order Specific Question:   Is the patient currently using CPAP in the home    Answer:   Yes    Order Specific Question:   Settings    Answer:   Autotitration    Order Specific Question:   CPAP supplies needed    Answer:   Mask, headgear, cushions, filters, heated tubing and water chamber      Follow up in 6 months or call earlier if needed   CC:  PCP: Wilfrid Lund, PA    I spent 26 minutes of face-to-face and non-face-to-face time with patient.  This included previsit chart review, lab review, study review, order entry, electronic health record documentation, patient education and discussion regarding recent diagnosis of sleep apnea with review and discussion of compliance report, importance of continued nightly CPAP use  and answered all the questions to patient satisfaction   Douglas Neal, AGNP-BC  [facility] [address] [place], [place] [address]  Phone [phone] Fax [phone] Note: This document was prepared with digital dictation and possible smart phrase technology. Any transcriptional errors that result from this process are unintentional.

## 2021-10-06 NOTE — Patient Instructions (Signed)
Your Plan:  Continue current use of CPAP - no changes today Continue to follow with your DME company for any needed supplies or CPAP related concerns   Follow-up in 6 months or call earlier if needed     Thank you for coming to see [place] at [facility]. I hope we have been able to provide you high quality care today.  You may receive a patient satisfaction survey over the next few weeks. We would appreciate your feedback and comments so that we may continue to improve ourselves and the health of our patients.

## 2021-10-06 NOTE — Progress Notes (Signed)
CPAP order has been sent to [facility].

## 2021-11-06 ENCOUNTER — Encounter: Payer: Self-pay | Admitting: Diagnostic Neuroimaging

## 2021-11-06 DIAGNOSIS — R413 Other amnesia: Secondary | ICD-10-CM

## 2021-11-30 DIAGNOSIS — G4733 Obstructive sleep apnea (adult) (pediatric): Secondary | ICD-10-CM | POA: Diagnosis not present

## 2021-12-24 ENCOUNTER — Telehealth: Payer: BC Managed Care – PPO | Admitting: Diagnostic Neuroimaging

## 2021-12-30 DIAGNOSIS — G4733 Obstructive sleep apnea (adult) (pediatric): Secondary | ICD-10-CM | POA: Diagnosis not present

## 2022-01-01 DIAGNOSIS — I10 Essential (primary) hypertension: Secondary | ICD-10-CM | POA: Diagnosis not present

## 2022-01-01 DIAGNOSIS — I351 Nonrheumatic aortic (valve) insufficiency: Secondary | ICD-10-CM | POA: Diagnosis not present

## 2022-01-01 DIAGNOSIS — R29818 Other symptoms and signs involving the nervous system: Secondary | ICD-10-CM | POA: Diagnosis not present

## 2022-01-01 DIAGNOSIS — R200 Anesthesia of skin: Secondary | ICD-10-CM | POA: Diagnosis not present

## 2022-01-05 NOTE — Addendum Note (Signed)
Addended by: Ann Maki on: 01/05/2022 02:29 PM ? ? Modules accepted: Orders ? ?

## 2022-01-07 ENCOUNTER — Telehealth: Payer: Self-pay | Admitting: Diagnostic Neuroimaging

## 2022-01-07 NOTE — Telephone Encounter (Signed)
Sent to [facility] via their epic, ph # [phone] ?

## 2022-01-28 DIAGNOSIS — G4733 Obstructive sleep apnea (adult) (pediatric): Secondary | ICD-10-CM | POA: Diagnosis not present

## 2022-01-30 DIAGNOSIS — L219 Seborrheic dermatitis, unspecified: Secondary | ICD-10-CM | POA: Diagnosis not present

## 2022-01-30 DIAGNOSIS — D225 Melanocytic nevi of trunk: Secondary | ICD-10-CM | POA: Diagnosis not present

## 2022-01-30 DIAGNOSIS — L821 Other seborrheic keratosis: Secondary | ICD-10-CM | POA: Diagnosis not present

## 2022-01-30 DIAGNOSIS — L578 Other skin changes due to chronic exposure to nonionizing radiation: Secondary | ICD-10-CM | POA: Diagnosis not present

## 2022-02-11 DIAGNOSIS — R4789 Other speech disturbances: Secondary | ICD-10-CM | POA: Diagnosis not present

## 2022-02-12 DIAGNOSIS — R4789 Other speech disturbances: Secondary | ICD-10-CM | POA: Diagnosis not present

## 2022-02-13 DIAGNOSIS — R4789 Other speech disturbances: Secondary | ICD-10-CM | POA: Diagnosis not present

## 2022-02-18 DIAGNOSIS — R768 Other specified abnormal immunological findings in serum: Secondary | ICD-10-CM | POA: Diagnosis not present

## 2022-02-18 DIAGNOSIS — R4789 Other speech disturbances: Secondary | ICD-10-CM | POA: Diagnosis not present

## 2022-02-28 DIAGNOSIS — G4733 Obstructive sleep apnea (adult) (pediatric): Secondary | ICD-10-CM | POA: Diagnosis not present

## 2022-03-09 DIAGNOSIS — R4789 Other speech disturbances: Secondary | ICD-10-CM | POA: Diagnosis not present

## 2022-03-09 DIAGNOSIS — F801 Expressive language disorder: Secondary | ICD-10-CM | POA: Diagnosis not present

## 2022-03-09 DIAGNOSIS — J341 Cyst and mucocele of nose and nasal sinus: Secondary | ICD-10-CM | POA: Diagnosis not present

## 2022-03-31 ENCOUNTER — Encounter: Payer: Self-pay | Admitting: Adult Health

## 2022-04-06 ENCOUNTER — Ambulatory Visit: Payer: BC Managed Care – PPO | Admitting: Adult Health

## 2022-06-04 DIAGNOSIS — R29818 Other symptoms and signs involving the nervous system: Secondary | ICD-10-CM | POA: Diagnosis not present

## 2022-06-04 DIAGNOSIS — I10 Essential (primary) hypertension: Secondary | ICD-10-CM | POA: Diagnosis not present

## 2022-06-04 DIAGNOSIS — I351 Nonrheumatic aortic (valve) insufficiency: Secondary | ICD-10-CM | POA: Diagnosis not present

## 2022-06-24 DIAGNOSIS — G3184 Mild cognitive impairment, so stated: Secondary | ICD-10-CM | POA: Diagnosis not present

## 2022-07-22 DIAGNOSIS — I080 Rheumatic disorders of both mitral and aortic valves: Secondary | ICD-10-CM | POA: Diagnosis not present

## 2022-07-30 DIAGNOSIS — R4789 Other speech disturbances: Secondary | ICD-10-CM | POA: Diagnosis not present

## 2022-08-17 DIAGNOSIS — R4789 Other speech disturbances: Secondary | ICD-10-CM | POA: Diagnosis not present

## 2022-08-17 DIAGNOSIS — R9402 Abnormal brain scan: Secondary | ICD-10-CM | POA: Diagnosis not present

## 2022-08-20 DIAGNOSIS — G3101 Pick's disease: Secondary | ICD-10-CM | POA: Diagnosis not present

## 2022-08-20 DIAGNOSIS — F0280 Dementia in other diseases classified elsewhere without behavioral disturbance: Secondary | ICD-10-CM | POA: Diagnosis not present

## 2022-09-03 DIAGNOSIS — I351 Nonrheumatic aortic (valve) insufficiency: Secondary | ICD-10-CM | POA: Diagnosis not present

## 2022-09-14 DIAGNOSIS — I351 Nonrheumatic aortic (valve) insufficiency: Secondary | ICD-10-CM | POA: Diagnosis not present

## 2022-09-18 DIAGNOSIS — I517 Cardiomegaly: Secondary | ICD-10-CM | POA: Diagnosis not present

## 2022-09-18 DIAGNOSIS — I351 Nonrheumatic aortic (valve) insufficiency: Secondary | ICD-10-CM | POA: Diagnosis not present

## 2022-10-05 DIAGNOSIS — Z13220 Encounter for screening for lipoid disorders: Secondary | ICD-10-CM | POA: Diagnosis not present

## 2022-10-05 DIAGNOSIS — R7301 Impaired fasting glucose: Secondary | ICD-10-CM | POA: Diagnosis not present

## 2022-10-24 ENCOUNTER — Emergency Department (HOSPITAL_BASED_OUTPATIENT_CLINIC_OR_DEPARTMENT_OTHER): Payer: BC Managed Care – PPO

## 2022-10-24 ENCOUNTER — Emergency Department (HOSPITAL_BASED_OUTPATIENT_CLINIC_OR_DEPARTMENT_OTHER)
Admission: EM | Admit: 2022-10-24 | Discharge: 2022-10-24 | Disposition: A | Discharge status: Home / Self Care | Payer: BC Managed Care – PPO | Attending: Emergency Medicine | Admitting: Emergency Medicine

## 2022-10-24 ENCOUNTER — Other Ambulatory Visit: Payer: Self-pay

## 2022-10-24 DIAGNOSIS — Z952 Presence of prosthetic heart valve: Secondary | ICD-10-CM | POA: Diagnosis not present

## 2022-10-24 DIAGNOSIS — R0789 Other chest pain: Secondary | ICD-10-CM | POA: Diagnosis not present

## 2022-10-24 DIAGNOSIS — R079 Chest pain, unspecified: Secondary | ICD-10-CM

## 2022-10-24 DIAGNOSIS — K219 Gastro-esophageal reflux disease without esophagitis: Secondary | ICD-10-CM | POA: Insufficient documentation

## 2022-10-24 LAB — BASIC METABOLIC PANEL
Anion gap: 7 (ref 5–15)
BUN: 26 mg/dL — ABNORMAL HIGH (ref 6–20)
CO2: 25 mmol/L (ref 22–32)
Calcium: 9.2 mg/dL (ref 8.9–10.3)
Chloride: 104 mmol/L (ref 98–111)
Creatinine, Ser: 1.02 mg/dL (ref 0.61–1.24)
GFR, Estimated: >60 mL/min (ref >60)
Glucose, Bld: 114 mg/dL — ABNORMAL HIGH (ref 70–99)
Potassium: 4.1 mmol/L (ref 3.5–5.1)
Sodium: 136 mmol/L (ref 135–145)

## 2022-10-24 LAB — CBC
HCT: 43.8 % (ref 39.0–52.0)
Hemoglobin: 15.9 g/dL (ref 13.0–17.0)
MCH: 32.1 pg (ref 26.0–34.0)
MCHC: 36.3 g/dL — ABNORMAL HIGH (ref 30.0–36.0)
MCV: 88.5 fL (ref 80.0–100.0)
Platelets: 173 10*3/uL (ref 150–400)
RBC: 4.95 MIL/uL (ref 4.22–5.81)
RDW: 11.9 % (ref 11.5–15.5)
WBC: 4.9 10*3/uL (ref 4.0–10.5)
nRBC: 0 % (ref 0.0–0.2)

## 2022-10-24 LAB — TROPONIN I (HIGH SENSITIVITY)
Troponin I (High Sensitivity): 5 ng/L (ref <18)
Troponin I (High Sensitivity): 5 ng/L (ref <18)

## 2022-10-24 MED ORDER — OMEPRAZOLE 20 MG PO CPDR: 20.0000 mg | DELAYED_RELEASE_CAPSULE | Freq: Every day | ORAL | 0 refills | Status: AC

## 2022-10-24 MED ORDER — FAMOTIDINE IN NACL 20-0.9 MG/50ML-% IV SOLN
20.0000 mg | Freq: Once | INTRAVENOUS | Status: AC
  Administered 2022-10-24: 20 mg via INTRAVENOUS
  Filled 2022-10-24: qty 50

## 2022-10-24 MED ORDER — SODIUM CHLORIDE 0.9 % IV SOLN: INTRAVENOUS | Status: DC | PRN

## 2022-10-24 MED ORDER — PANTOPRAZOLE SODIUM 40 MG IV SOLR
40.0000 mg | Freq: Once | INTRAVENOUS | Status: AC
  Administered 2022-10-24: 40 mg via INTRAVENOUS
  Filled 2022-10-24: qty 10

## 2022-10-24 NOTE — Discharge Instructions (Signed)
You were seen today for a burning chest pain.  Your workup was reassuring for no signs of acute coronary syndrome.  This is likely due to some level of acid reflux.  I have prescribed Prilosec to be taken once daily as directed.  Please follow-up with your primary care provider and cardiology providers as needed for further evaluation and management.  If you develop any life-threatening symptoms please return to the emergency department.

## 2022-10-24 NOTE — ED Triage Notes (Signed)
Patient presents to ED via POV from home. Here with 1 week history of chest burning. Open heart surgery scheduled at [facility] 11/10/22 "for a leaky valve". Denies shortness of breath or dizziness. Well appearing.

## 2022-10-24 NOTE — ED Provider Notes (Signed)
[facility] EMERGENCY DEPARTMENT Provider Note   CSN: [id number] Arrival date & time: 10/24/22  1054     History  Chief Complaint  Patient presents with   Chest Pain    Douglas Neal is a 56 y.o. male.  Patient presents emergency department from home complaining of a 1 week history of a burning feeling in the central chest.  Patient denies any shortness of breath, nausea, vomiting, abdominal pain, radiation of symptoms.  Patient's wife states patient has increased anxiety due to upcoming open heart surgery due to an aortic valve disorder.  Patient does endorse a history of gastroesophageal reflux disease and no longer takes medication for this disorder.  He states it does feel somewhat like previous reflux but also feels different than previous reflux episodes.  Past medical history otherwise significant for hypertension.  HPI     Home Medications Prior to Admission medications   Medication Sig Start Date End Date Taking? Authorizing Provider  omeprazole (PRILOSEC) 20 MG capsule Take 1 capsule (20 mg total) by mouth daily. 10/24/22 11/23/22 Yes Darrick Grinder, PA-C  amLODipine (NORVASC) 5 MG tablet Take 1 tablet by mouth daily. 02/09/21   [provider]  aspirin 81 MG chewable tablet Chew 1 tablet (81 mg total) by mouth daily. 05/09/20   Henderly, Britni A, PA-C  carvedilol (COREG) 6.25 MG tablet Take 6.25 mg by mouth 2 (two) times daily. 02/27/21   [provider]  hydrochlorothiazide (HYDRODIURIL) 25 MG tablet Take 25 mg by mouth daily. 05/16/20   [provider]  sildenafil (REVATIO) 20 MG tablet Take 20 mg by mouth daily as needed (erectile dysfunction).    [provider]      Allergies    Penicillins    Review of Systems   Review of Systems  Respiratory:  Negative for shortness of breath.   Cardiovascular:  Positive for chest pain. Negative for palpitations.  Gastrointestinal:  Negative for abdominal pain, nausea and  vomiting.    Physical Exam Updated Vital Signs BP (!) 144/69   Pulse (!) 55   Temp 98 F (36.7 C) (Oral)   Resp 14   SpO2 97%  Physical Exam Vitals and nursing note reviewed.  Constitutional:      General: He is not in acute distress.    Appearance: He is well-developed.  HENT:     Head: Normocephalic and atraumatic.  Eyes:     Conjunctiva/sclera: Conjunctivae normal.  Cardiovascular:     Rate and Rhythm: Normal rate and regular rhythm.  Pulmonary:     Effort: Pulmonary effort is normal. No respiratory distress.     Breath sounds: Normal breath sounds.  Abdominal:     Palpations: Abdomen is soft.     Tenderness: There is no abdominal tenderness.  Musculoskeletal:        General: No swelling.     Cervical back: Neck supple.     Right lower leg: No edema.     Left lower leg: No edema.  Skin:    General: Skin is warm and dry.     Capillary Refill: Capillary refill takes less than 2 seconds.  Neurological:     Mental Status: He is alert.  Psychiatric:        Mood and Affect: Mood normal.     ED Results / Procedures / Treatments   Labs (all labs ordered are listed, but only abnormal results are displayed) Labs Reviewed  BASIC METABOLIC PANEL - Abnormal; Notable for the  following components:      Result Value   Glucose, Bld 114 (*)    BUN 26 (*)    All other components within normal limits  CBC - Abnormal; Notable for the following components:   MCHC 36.3 (*)    All other components within normal limits  TROPONIN I (HIGH SENSITIVITY)  TROPONIN I (HIGH SENSITIVITY)    EKG EKG Interpretation  Date/Time:  Saturday October 24 2022 11:00:18 EST Ventricular Rate:  55 PR Interval:  176 QRS Duration: 96 QT Interval:  440 QTC Calculation: 420 R Axis:   34 Text Interpretation: Sinus bradycardia Nonspecific T wave abnormality Abnormal ECG No previous ECGs available Confirmed by Vanetta Mulders [phone]) on 10/24/2022 11:09:04 AM  Radiology DG Chest Port 1  View  Result Date: 10/24/2022 CLINICAL DATA:  One-week history of chest burning. Scheduled for heart surgery and valve replacement on 11/10/2022. EXAM: PORTABLE CHEST 1 VIEW COMPARISON:  01/09/2008. FINDINGS: Cardiac silhouette normal in size. Normal mediastinal and hilar contours. Clear lungs.  No pleural effusion or pneumothorax. Skeletal structures are intact. IMPRESSION: No active disease. Electronically Signed   By: Amie Portland M.D.   On: 10/24/2022 11:21    Procedures Procedures    Medications Ordered in ED Medications  0.9 %  sodium chloride infusion ( Intravenous New Bag/Given 10/24/22 1243)  pantoprazole (PROTONIX) injection 40 mg (40 mg Intravenous Given 10/24/22 1243)  famotidine (PEPCID) IVPB 20 mg premix (20 mg Intravenous New Bag/Given 10/24/22 1247)    ED Course/ Medical Decision Making/ A&P                           Medical Decision Making Amount and/or Complexity of Data Reviewed Labs: ordered. Radiology: ordered.  Risk Prescription drug management.   Patient presents with a chief complaint of epigastric burning.  Differential diagnosis includes but is not limited to GERD, ACS, dissection, and others  I reviewed the patient's past medical history including outpatient note from cardiology showing diagnosis of aortic valve insufficiency  I ordered and reviewed labs.  Pertinent results include initial and repeat troponin of 5, unremarkable BMP, unremarkable CBC  I ordered and interpreted imaging including a chest x-ray.  No active disease noted on chest x-ray I agree with radiologist findings  I ordered the patient Pepcid and Protonix for reflux-like symptoms.  Upon reassessment the patient was feeling somewhat better  The patient's symptoms seem more consistent with reflux than ACS.  Very low clinical suspicion of ACS with negative troponins and no ischemic changes on EKG.  EKG shows underlying sinus bradycardia.  No shortness of breath at suggest pulmonary  embolism.  No clinical signs of dissection.  Chest x-ray shows no signs of pneumonia.  There was some clinical improvement with the PPI and Pepcid.  Plan to discharge patient home at this time with prescription for PPI and recommendations to keep upcoming appointments with cardiothoracic surgery.  Patient voices understanding.  Return precautions provided        Final Clinical Impression(s) / ED Diagnoses Final diagnoses:  Chest pain, unspecified type  Gastroesophageal reflux disease, unspecified whether esophagitis present    Rx / DC Orders ED Discharge Orders          Ordered    omeprazole (PRILOSEC) 20 MG capsule  Daily        10/24/22 1353              Darrick Grinder, [place] 10/24/22 1355  Vanetta Mulders, MD 10/26/22 [phone]

## 2022-10-29 DIAGNOSIS — Z01818 Encounter for other preprocedural examination: Secondary | ICD-10-CM | POA: Diagnosis not present

## 2022-10-29 DIAGNOSIS — I351 Nonrheumatic aortic (valve) insufficiency: Secondary | ICD-10-CM | POA: Diagnosis not present

## 2022-10-29 DIAGNOSIS — Z01812 Encounter for preprocedural laboratory examination: Secondary | ICD-10-CM | POA: Diagnosis not present

## 2022-11-09 DIAGNOSIS — Z9889 Other specified postprocedural states: Secondary | ICD-10-CM | POA: Diagnosis not present

## 2022-11-09 DIAGNOSIS — J984 Other disorders of lung: Secondary | ICD-10-CM | POA: Diagnosis not present

## 2022-11-09 DIAGNOSIS — Z7982 Long term (current) use of aspirin: Secondary | ICD-10-CM | POA: Diagnosis not present

## 2022-11-09 DIAGNOSIS — Z8249 Family history of ischemic heart disease and other diseases of the circulatory system: Secondary | ICD-10-CM | POA: Diagnosis not present

## 2022-11-09 DIAGNOSIS — D696 Thrombocytopenia, unspecified: Secondary | ICD-10-CM | POA: Diagnosis not present

## 2022-11-09 DIAGNOSIS — Z885 Allergy status to narcotic agent status: Secondary | ICD-10-CM | POA: Diagnosis not present

## 2022-11-09 DIAGNOSIS — Z833 Family history of diabetes mellitus: Secondary | ICD-10-CM | POA: Diagnosis not present

## 2022-11-09 DIAGNOSIS — J95821 Acute postprocedural respiratory failure: Secondary | ICD-10-CM | POA: Diagnosis not present

## 2022-11-09 DIAGNOSIS — Z952 Presence of prosthetic heart valve: Secondary | ICD-10-CM | POA: Diagnosis not present

## 2022-11-09 DIAGNOSIS — Z79899 Other long term (current) drug therapy: Secondary | ICD-10-CM | POA: Diagnosis not present

## 2022-11-09 DIAGNOSIS — G3101 Pick's disease: Secondary | ICD-10-CM | POA: Diagnosis not present

## 2022-11-09 DIAGNOSIS — D62 Acute posthemorrhagic anemia: Secondary | ICD-10-CM | POA: Diagnosis not present

## 2022-11-09 DIAGNOSIS — I351 Nonrheumatic aortic (valve) insufficiency: Secondary | ICD-10-CM | POA: Diagnosis not present

## 2022-11-09 DIAGNOSIS — F0280 Dementia in other diseases classified elsewhere without behavioral disturbance: Secondary | ICD-10-CM | POA: Diagnosis not present

## 2022-11-09 DIAGNOSIS — Z806 Family history of leukemia: Secondary | ICD-10-CM | POA: Diagnosis not present

## 2022-11-09 DIAGNOSIS — I10 Essential (primary) hypertension: Secondary | ICD-10-CM | POA: Diagnosis not present

## 2022-11-09 DIAGNOSIS — R918 Other nonspecific abnormal finding of lung field: Secondary | ICD-10-CM | POA: Diagnosis not present

## 2022-11-09 DIAGNOSIS — R2689 Other abnormalities of gait and mobility: Secondary | ICD-10-CM | POA: Diagnosis not present

## 2022-11-09 DIAGNOSIS — Z4682 Encounter for fitting and adjustment of non-vascular catheter: Secondary | ICD-10-CM | POA: Diagnosis not present

## 2022-11-09 DIAGNOSIS — I2510 Atherosclerotic heart disease of native coronary artery without angina pectoris: Secondary | ICD-10-CM | POA: Diagnosis not present

## 2022-11-09 DIAGNOSIS — Z880 Allergy status to penicillin: Secondary | ICD-10-CM | POA: Diagnosis not present

## 2022-11-25 DIAGNOSIS — R4701 Aphasia: Secondary | ICD-10-CM | POA: Diagnosis not present

## 2022-11-25 DIAGNOSIS — D696 Thrombocytopenia, unspecified: Secondary | ICD-10-CM | POA: Diagnosis not present

## 2022-11-25 DIAGNOSIS — D500 Iron deficiency anemia secondary to blood loss (chronic): Secondary | ICD-10-CM | POA: Diagnosis not present

## 2022-11-25 DIAGNOSIS — I10 Essential (primary) hypertension: Secondary | ICD-10-CM | POA: Diagnosis not present

## 2022-12-01 DIAGNOSIS — F0280 Dementia in other diseases classified elsewhere without behavioral disturbance: Secondary | ICD-10-CM | POA: Diagnosis not present

## 2022-12-01 DIAGNOSIS — G3101 Pick's disease: Secondary | ICD-10-CM | POA: Diagnosis not present

## 2022-12-31 DIAGNOSIS — G3101 Pick's disease: Secondary | ICD-10-CM | POA: Diagnosis not present

## 2022-12-31 DIAGNOSIS — F0280 Dementia in other diseases classified elsewhere without behavioral disturbance: Secondary | ICD-10-CM | POA: Diagnosis not present

## 2022-12-31 DIAGNOSIS — I10 Essential (primary) hypertension: Secondary | ICD-10-CM | POA: Diagnosis not present

## 2022-12-31 DIAGNOSIS — Z952 Presence of prosthetic heart valve: Secondary | ICD-10-CM | POA: Diagnosis not present

## 2023-02-02 DIAGNOSIS — D1801 Hemangioma of skin and subcutaneous tissue: Secondary | ICD-10-CM | POA: Diagnosis not present

## 2023-02-02 DIAGNOSIS — L821 Other seborrheic keratosis: Secondary | ICD-10-CM | POA: Diagnosis not present

## 2023-02-02 DIAGNOSIS — L578 Other skin changes due to chronic exposure to nonionizing radiation: Secondary | ICD-10-CM | POA: Diagnosis not present

## 2023-02-02 DIAGNOSIS — D225 Melanocytic nevi of trunk: Secondary | ICD-10-CM | POA: Diagnosis not present

## 2023-02-02 DIAGNOSIS — D485 Neoplasm of uncertain behavior of skin: Secondary | ICD-10-CM | POA: Diagnosis not present

## 2023-02-08 ENCOUNTER — Other Ambulatory Visit: Payer: Self-pay

## 2023-02-08 ENCOUNTER — Ambulatory Visit: Payer: BC Managed Care – PPO | Attending: Neurology | Admitting: Speech Pathology

## 2023-02-08 DIAGNOSIS — R4701 Aphasia: Secondary | ICD-10-CM | POA: Diagnosis not present

## 2023-02-08 NOTE — Therapy (Signed)
OUTPATIENT SPEECH LANGUAGE PATHOLOGY APHASIA EVALUATION   Patient Name: Douglas Neal MRN: [id number] DOB:23-May-1966, 57 y.o., male Today's Date: 02/08/2023  PCP: Douglas Cagey, MD REFERRING PROVIDER:   [name], MD    END OF SESSION:  End of Session - 02/08/23 1500     Visit Number 1    Number of Visits 25    Date for SLP Re-Evaluation 05/03/23    Authorization Type BCBS    SLP Start Time 0845    SLP Stop Time  0930    SLP Time Calculation (min) 45 min    Activity Tolerance Patient tolerated treatment well             Past Medical History:  Diagnosis Date   Hypertension    No past surgical history on file. Patient Active Problem List   Diagnosis Date Noted   ALLERGIC RHINITIS 03/02/2008   ASTHMA 03/02/2008   COUGH 03/02/2008    ONSET DATE: 2023   REFERRING DIAG: R47.01 (ICD-10-CM) - Aphasia   THERAPY DIAG:  Aphasia  Rationale for Evaluation and Treatment: Rehabilitation  SUBJECTIVE:   SUBJECTIVE STATEMENT: "It just comes and goes" re: aphasia  Pt accompanied by: self  PERTINENT HISTORY: Douglas Neal is a 57 y.o., right handed male who presents for a cognitive evaluation.   Patient was initially seen by Dr. Antonietta Neal in April 2023. 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 At that time he denied sleep problems, appetite changes, changes in smell or taste, hallucinations, was very insightful regarding these difficulties, denied any dysarthric changes.  2 years prior to this he had a possible TIA Douglas Neal, imaging was unremarkable, had some changes in his expressive language that lasted about a day.  He was seen at [facility] neurology in December 2022, seen for memory issues, sleep disruption, snoring, noted to have moderate OSA and started on AutoPap in August 2022.  An MRI scan was  obtained in May 2023, and a PET scan was obtained after that. 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 No posterior hypometabolism to suggest ED or leukopenic PPA.   PAIN:  Are you having pain? Yes: NPRS scale: 2/10 Pain location: chest Pain description: n/a Aggravating factors: n/a Relieving factors: n/a  FALLS: Has patient fallen in last 6 months?  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[employer])  PATIENT GOALS: Pt wishes to improve his word-finding and ability to communicate at home, work, and the community   OBJECTIVE:   DIAGNOSTIC FINDINGS: n/a  COGNITION: Overall cognitive status: Within functional limits for tasks assessed  AUDITORY COMPREHENSION: Overall auditory comprehension: Appears intact YES/NO questions: Impaired: moderately complex Following directions: Appears intact Conversation:  Pt demonstrated comprehension of all topics discussed during eval   READING COMPREHENSION: Intact  EXPRESSION: verbal  VERBAL EXPRESSION: Level of generative/spontaneous verbalization: conversation Automatic speech: counting: intact  Repetition: Impaired: sentence Naming: Confrontation: 76-100% Pragmatics: Appears intact Reading: 85% accuracy. Observed to skip connection words  and shorter words, such as "the" and "you"  Comments: Pt with frequently telegraphic speech Interfering components:  PPA Effective technique: phonemic cues Non-verbal means of communication: N/A  WRITTEN EXPRESSION: Dominant hand: right Written expression: Impaired: sentence (missing  some words, especially small connector words (e.g. "the"))   MOTOR SPEECH: Overall motor speech: Appears intact  STANDARDIZED ASSESSMENTS: QAB: Moderate  (Total: 6.41, range of moderate is 5-7.49).   [place] Naming Short Form: 13/15. When given choice from four, 15/15   PATIENT REPORTED OUTCOME MEASURES (PROM): Satisfaction with Participation in Social Roles:  Pt is somewhat dissatisfied with his ability to work and the quality of his work (including at home). Overall, pt 'quite a bit' satisfied with most items on PROM. Total- 60  TODAY'S TREATMENT:                                                                                                                                         DATE:   02-08-23: Pt goes by Douglas Neal. Educated pt on progressive nature of PPA and need to consider AAC in the future to supplement spoken communication.  HEP: Fill out list of names of people pt interacts with and places he frequents. (Collecting data for AAC.)    PATIENT EDUCATION: Education details: See above Person educated: Patient Education method: Explanation Education comprehension: verbalized understanding and needs further education   GOALS: Goals reviewed with patient? Yes  SHORT TERM GOALS: Target date: 03/08/2023    Pt will teach back anomia strategies and compensations during structured tasks in 80% of opportunities given rare min-A.  Baseline: Goal status: INITIAL  2.  Pt will effectively demonstrate anomia strategies and compensations during structured tasks given rare min-A across 2-3 sessions. Baseline:  Goal status: INITIAL  3.  Pt will report effectively implementing anomia compensations and strategies at home and in the community to fix communicative breakdowns across 1 week period, given occasional min-A.  Baseline:  Goal status: INITIAL  4.  Pt will use multimodal communication to participate in four turns of conversation across 2 therapy sessions, given occasional min-A.   Baseline:  Goal status: INITIAL   LONG TERM GOALS: Target date: 05/03/23  Pt will report improved perception of his communication via PROM by 1 - 2 points by discharge. Baseline: 60 total on Satisfaction with Participation in Social Roles Goal status: INITIAL  2.  Pt will report effectively implementing anomia compensations and strategies at home and in the community to fix communicative breakdowns across 2 week period, given rare min-A.  Baseline:  Goal status: INITIAL  3.  Pt will use AAC/multimodal communication to communicate with clients, wait staff, and phone calls with occasional min A Baseline:  Goal status: INITIAL  4.  Pt will use compensations for aphasia to answer phones successfully at work Baseline:  Goal status: INITIAL  ASSESSMENT:  CLINICAL IMPRESSION: Patient is a 57 y.o. male who was  seen today for aphasia evaluation. 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 Communication characterized by telegraphic speech, word-finding difficulty causing circumlocutions and self-corrections, difficulty repeating, speaking, and writing moderately complex phrases and sentences, and moderate-to-severe aphasia. Administered QAB and [employer]. Pt reports difficulty communicating across all contexts and that his communicative effectiveness fluctuates. Pt would benefit from skilled ST services to address the aforementioned deficits and to improve overall QoL and communicative efficacy at home, work, and in the community. Pt educated on the progressive nature of PPA and that he would benefit from beginning to create robust AAC to utilize as disease progresses. Pt agreeable and verbalized understanding.   OBJECTIVE IMPAIRMENTS: include aphasia. These impairments are limiting patient from effectively communicating at home and in community. Factors affecting potential to achieve goals and functional outcome are medical prognosis. Patient will benefit from skilled SLP services  to address above impairments and improve overall function.  REHAB POTENTIAL: Good  PLAN:  SLP FREQUENCY: 2x/week  SLP DURATION: 12 weeks  PLANNED INTERVENTIONS: Language facilitation, Multimodal communication approach, SLP instruction and feedback, Compensatory strategies, and Patient/family education    Dara HoyerLovvorn, Laura Ann, CCC-SLP 02/08/2023, 3:06 PM

## 2023-02-19 ENCOUNTER — Ambulatory Visit: Payer: BC Managed Care – PPO

## 2023-02-19 DIAGNOSIS — R4701 Aphasia: Secondary | ICD-10-CM

## 2023-02-19 NOTE — Patient Instructions (Addendum)

## 2023-02-19 NOTE — Therapy (Signed)
OUTPATIENT SPEECH LANGUAGE PATHOLOGY APHASIA TREATMENT   Patient Name: Douglas Neal MRN: [id number] DOB:Neal 22, 1967, 57 y.o., male Today's Date: 02/19/2023  PCP: Douglas Cagey, MD REFERRING PROVIDER:   [name], MD    END OF SESSION:  End of Session - 02/19/23 0845     Visit Number 2    Number of Visits 25    Date for SLP Re-Evaluation 05/03/23    Authorization Type BCBS    SLP Start Time 0845    SLP Stop Time  0930    SLP Time Calculation (min) 45 min    Activity Tolerance Patient tolerated treatment well              Past Medical History:  Diagnosis Date   Hypertension    History reviewed. No pertinent surgical history. Patient Active Problem List   Diagnosis Date Noted   ALLERGIC RHINITIS 03/02/2008   ASTHMA 03/02/2008   COUGH 03/02/2008    ONSET DATE: 2023   REFERRING DIAG: R47.01 (ICD-10-CM) - Aphasia   THERAPY DIAG: Aphasia  Rationale for Evaluation and Treatment: Rehabilitation  SUBJECTIVE:   SUBJECTIVE STATEMENT: "It comes and goes"  Pt accompanied by: self  PERTINENT HISTORY: Douglas Neal is a 57 y.o., right handed male who presents for a cognitive evaluation.   Patient was initially seen by Dr. Antonietta Neal in April 2023. 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 At that time he denied sleep problems, appetite changes, changes in smell or taste, hallucinations, was very insightful regarding these difficulties, denied any dysarthric changes.  2 years prior to this he had a possible TIA Douglas Neal, imaging was unremarkable, had some changes in his expressive language that lasted about a day.  He was seen at [facility] neurology in December 2022, seen for memory issues, sleep disruption, snoring, noted to have moderate OSA and started on AutoPap in August 2022.  An MRI scan was  obtained in May 2023, and a PET scan was obtained after that. 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 No posterior hypometabolism to suggest ED or leukopenic PPA.   PAIN:  Are you having pain? Yes: NPRS scale: 2/10 Pain location: chest Pain description: n/a Aggravating factors: n/a Relieving factors: n/a  FALLS: Has patient fallen in last 6 months?  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 Educated patient on plan to generate communication support with personally relevant information to aid verbal expression as PPA progresses. Targeted discussion of personally relevant topics to ID information to incorporate in communication supports (see handout). Frequent prompting required to expand upon responses and ID specific terms. Some benefit of additional time and independent cued use of phone to aid word retrieval, particularly in discussion of favorite television shows. Also initiated instruction of anomia strategies as pt would benefit from additional compensations. Further education and instruction warranted.    02-08-23: Pt goes by Douglas Neal. Educated pt on progressive nature of PPA and need to consider AAC in the future to supplement spoken communication. HEP: Fill out list of names of people pt interacts with and places he frequents. (Collecting data for AAC.)    PATIENT EDUCATION: Education details: See above Person educated: Patient Education method: Explanation Education comprehension: verbalized understanding and needs further education   GOALS: Goals reviewed with patient? Yes  SHORT TERM GOALS: Target date: 03/08/2023    Pt will teach back anomia strategies and compensations during structured tasks in 80% of opportunities given rare min-A.  Baseline: Goal status: IN PROGRESS  2.  Pt will effectively demonstrate anomia strategies and compensations during structured tasks given rare min-A across 2-3 sessions. Baseline:  Goal status: IN PROGRESS  3.  Pt will report effectively implementing anomia compensations and strategies at home and in the community to fix communicative breakdowns across 1 week period, given occasional min-A.  Baseline:  Goal status: IN PROGRESS  4.  Pt will use multimodal communication to participate in four turns of conversation across 2 therapy sessions, given occasional min-A.  Baseline:  Goal status: IN PROGRESS   LONG TERM GOALS: Target date: 05/03/23  Pt will report improved perception of his communication via PROM by 1 - 2 points by discharge. Baseline: 60 total on Satisfaction with Participation in Social Roles Goal status: IN PROGRESS  2.  Pt will report effectively implementing anomia compensations and strategies at home and in the community to fix communicative breakdowns across 2 week period, given rare min-A.  Baseline:  Goal status: IN PROGRESS  3.  Pt will use AAC/multimodal communication to communicate with clients, wait staff, and phone calls with occasional min A Baseline:  Goal status: IN PROGRESS  4.  Pt will use compensations for  aphasia to answer phones successfully at work Baseline:  Goal status: IN PROGRESS  ASSESSMENT:  CLINICAL IMPRESSION: Patient is a 57 y.o. male who was seen today for aphasia. 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 Communication characterized by telegraphic speech, word-finding difficulty causing circumlocutions and self-corrections, difficulty repeating, speaking, and writing moderately complex phrases and sentences, and moderate-to-severe aphasia. Pt reports difficulty communicating across all contexts and that his communicative effectiveness fluctuates. Pt would benefit from skilled ST services to address the aforementioned deficits and to improve overall QoL and communicative efficacy at home, work, and in the community. Pt educated on the progressive nature of PPA and that he would benefit from beginning to create robust AAC to utilize as disease progresses. Pt agreeable and verbalized understanding.   OBJECTIVE IMPAIRMENTS: include aphasia. These impairments are limiting patient from effectively communicating at home and in community. Factors affecting potential to achieve goals and functional outcome are medical prognosis. Patient will benefit from skilled SLP services to address above impairments and improve overall function.  REHAB  POTENTIAL: Good  PLAN:  SLP FREQUENCY: 2x/week  SLP DURATION: 12 weeks  PLANNED INTERVENTIONS: Language facilitation, Multimodal communication approach, SLP instruction and feedback, Compensatory strategies, and Patient/family education    Gracy Racer, CCC-SLP 02/19/2023, 11:36 AM

## 2023-02-22 NOTE — Therapy (Signed)
[facility] [address] [place], [place], [address] Phone: [phone]   Fax:  [phone]  Patient Details  Name: Douglas Neal MRN: [id number] Date of Birth: August 29, 1966 Referring Provider:  [name], MD   Encounter Date: 02/22/2023  SPEECH THERAPY DISCHARGE SUMMARY  Visits from Start of Care: 2  Current functional level related to goals / functional outcomes: Discharge completed as pt requested to cancel remaining ST appointments. Pt would benefit from ST in future d/t progressive nature of PPA.    Remaining deficits: PPA   Education / Equipment: Communication support, PPA education   Patient agrees to discharge. Patient goals were not met. Patient is being discharged due to the patient's request..  SHORT TERM GOALS: Target date: 03/08/2023     Pt will teach back anomia strategies and compensations during structured tasks in 80% of opportunities given rare min-A.  Baseline: Goal status: NOT MET   2.  Pt will effectively demonstrate anomia strategies and compensations during structured tasks given rare min-A across 2-3 sessions. Baseline:  Goal status: NOT MET   3.  Pt will report effectively implementing anomia compensations and strategies at home and in the community to fix communicative breakdowns across 1 week period, given occasional min-A.  Baseline:  Goal status: NOT MET   4.  Pt will use multimodal communication to participate in four turns of conversation across 2 therapy sessions, given occasional min-A.  Baseline:  Goal status: NOT MET      LONG TERM GOALS: Target date: 05/03/23   Pt will report improved perception of his communication via PROM by 1 - 2 points by discharge. Baseline: 60 total on Satisfaction with Participation in Social Roles Goal status: NOT MET   2.  Pt will report effectively implementing anomia compensations and strategies at home and in the community to fix communicative  breakdowns across 2 week period, given rare min-A.  Baseline:  Goal status: NOT MET   3.  Pt will use AAC/multimodal communication to communicate with clients, wait staff, and phone calls with occasional min A Baseline:  Goal status: NOT MET     4.  Pt will use compensations for aphasia to answer phones successfully at work Baseline:  Goal status: NOT MET    Gracy Racer, CCC-SLP 02/22/2023, 1:07 PM  [place] [facility] [address] [place], [place], [address] Phone: [phone]   Fax:  [phone]

## 2023-02-24 ENCOUNTER — Ambulatory Visit: Payer: BC Managed Care – PPO

## 2023-02-26 ENCOUNTER — Ambulatory Visit: Payer: BC Managed Care – PPO | Admitting: Speech Pathology

## 2023-03-01 ENCOUNTER — Ambulatory Visit: Payer: BC Managed Care – PPO | Admitting: Speech Pathology

## 2023-03-03 ENCOUNTER — Ambulatory Visit: Payer: BC Managed Care – PPO | Admitting: Speech Pathology

## 2023-03-08 ENCOUNTER — Ambulatory Visit: Payer: BC Managed Care – PPO | Admitting: Speech Pathology

## 2023-03-15 ENCOUNTER — Encounter: Payer: BC Managed Care – PPO | Admitting: Speech Pathology

## 2023-03-17 ENCOUNTER — Encounter: Payer: BC Managed Care – PPO | Admitting: Speech Pathology

## 2023-03-22 ENCOUNTER — Encounter: Payer: BC Managed Care – PPO | Admitting: Speech Pathology

## 2023-03-22 DIAGNOSIS — Z953 Presence of xenogenic heart valve: Secondary | ICD-10-CM | POA: Diagnosis not present

## 2023-03-22 DIAGNOSIS — I517 Cardiomegaly: Secondary | ICD-10-CM | POA: Diagnosis not present

## 2023-03-24 ENCOUNTER — Encounter: Payer: BC Managed Care – PPO | Admitting: Speech Pathology

## 2023-03-31 ENCOUNTER — Encounter: Payer: BC Managed Care – PPO | Admitting: Speech Pathology

## 2023-04-07 ENCOUNTER — Encounter: Payer: BC Managed Care – PPO | Admitting: Speech Pathology

## 2023-04-12 ENCOUNTER — Encounter: Payer: BC Managed Care – PPO | Admitting: Speech Pathology

## 2023-07-01 DIAGNOSIS — G3101 Pick's disease: Secondary | ICD-10-CM | POA: Diagnosis not present

## 2023-07-01 DIAGNOSIS — R200 Anesthesia of skin: Secondary | ICD-10-CM | POA: Diagnosis not present

## 2023-07-01 DIAGNOSIS — I10 Essential (primary) hypertension: Secondary | ICD-10-CM | POA: Diagnosis not present

## 2023-07-01 DIAGNOSIS — Z952 Presence of prosthetic heart valve: Secondary | ICD-10-CM | POA: Diagnosis not present

## 2023-09-23 DIAGNOSIS — F0280 Dementia in other diseases classified elsewhere without behavioral disturbance: Secondary | ICD-10-CM | POA: Diagnosis not present

## 2023-09-23 DIAGNOSIS — G3101 Pick's disease: Secondary | ICD-10-CM | POA: Diagnosis not present
